# Patient Record
Sex: Female | Born: 1935 | Race: White | Hispanic: No | Smoking: Never smoker
Health system: Southern US, Community
[De-identification: ages and names within clinical notes are randomized; demographics above are authoritative.]

## PROBLEM LIST (undated history)

## (undated) DIAGNOSIS — I4891 Unspecified atrial fibrillation: Secondary | ICD-10-CM

---

## 2003-06-09 ENCOUNTER — Other Ambulatory Visit: Payer: Self-pay

## 2004-11-07 ENCOUNTER — Ambulatory Visit: Payer: Self-pay | Admitting: Internal Medicine

## 2005-02-21 ENCOUNTER — Ambulatory Visit: Payer: Self-pay | Admitting: Ophthalmology

## 2005-02-27 ENCOUNTER — Ambulatory Visit: Payer: Self-pay | Admitting: Ophthalmology

## 2005-08-14 ENCOUNTER — Ambulatory Visit: Payer: Self-pay | Admitting: Surgery

## 2005-12-22 ENCOUNTER — Emergency Department: Payer: Self-pay | Admitting: Emergency Medicine

## 2006-05-24 ENCOUNTER — Ambulatory Visit: Payer: Self-pay | Admitting: Internal Medicine

## 2006-09-23 ENCOUNTER — Ambulatory Visit: Payer: Self-pay | Admitting: Internal Medicine

## 2006-10-16 ENCOUNTER — Ambulatory Visit: Payer: Self-pay | Admitting: Internal Medicine

## 2010-08-25 ENCOUNTER — Ambulatory Visit: Payer: Self-pay | Admitting: Internal Medicine

## 2011-09-18 ENCOUNTER — Ambulatory Visit: Payer: Self-pay | Admitting: Internal Medicine

## 2011-11-06 LAB — BASIC METABOLIC PANEL
BUN: 19 mg/dL — ABNORMAL HIGH (ref 7–18)
Co2: 22 mmol/L (ref 21–32)
Creatinine: 0.98 mg/dL (ref 0.60–1.30)
Glucose: 144 mg/dL — ABNORMAL HIGH (ref 65–99)
Osmolality: 254 (ref 275–301)
Potassium: 4.3 mmol/L (ref 3.5–5.1)
Sodium: 124 mmol/L — ABNORMAL LOW (ref 136–145)

## 2011-11-06 LAB — CK TOTAL AND CKMB (NOT AT ARMC)
CK, Total: 122 U/L (ref 21–215)
CK-MB: 2.9 ng/mL (ref 0.5–3.6)

## 2011-11-06 LAB — CBC
HCT: 36.1 % (ref 35.0–47.0)
HGB: 12 g/dL (ref 12.0–16.0)
MCH: 28.8 pg (ref 26.0–34.0)
RBC: 4.18 10*6/uL (ref 3.80–5.20)
WBC: 5.2 10*3/uL (ref 3.6–11.0)

## 2011-11-07 ENCOUNTER — Inpatient Hospital Stay: Payer: Self-pay | Admitting: Internal Medicine

## 2011-11-07 LAB — TROPONIN I
Troponin-I: <0.02 ng/mL
Troponin-I: <0.02 ng/mL

## 2011-11-07 LAB — URINALYSIS, COMPLETE
Bacteria: NONE SEEN
Bilirubin,UR: NEGATIVE
Blood: NEGATIVE
Glucose,UR: NEGATIVE mg/dL (ref 0–75)
Ketone: NEGATIVE
Leukocyte Esterase: NEGATIVE
Squamous Epithelial: NONE SEEN

## 2011-11-07 LAB — PROTEIN, URINE, RANDOM: Protein, Random Urine: <5 mg/dL — ABNORMAL LOW (ref 0–12)

## 2011-11-07 LAB — CK TOTAL AND CKMB (NOT AT ARMC)
CK, Total: 208 U/L (ref 21–215)
CK-MB: 3.2 ng/mL (ref 0.5–3.6)

## 2011-11-07 LAB — OSMOLALITY, URINE: Osmolality: 183 mOsm/kg

## 2011-11-07 LAB — SODIUM
Sodium: 132 mmol/L — ABNORMAL LOW (ref 136–145)
Sodium: 132 mmol/L — ABNORMAL LOW (ref 136–145)

## 2011-11-08 LAB — CBC WITH DIFFERENTIAL/PLATELET
Eosinophil %: 3.2 %
HCT: 33.5 % — ABNORMAL LOW (ref 35.0–47.0)
HGB: 11.1 g/dL — ABNORMAL LOW (ref 12.0–16.0)
MCH: 28.7 pg (ref 26.0–34.0)
MCHC: 33.2 g/dL (ref 32.0–36.0)
Monocyte #: 0.5 x10 3/mm (ref 0.2–0.9)
Monocyte %: 12.2 %
Platelet: 184 10*3/uL (ref 150–440)

## 2011-11-08 LAB — BASIC METABOLIC PANEL
Calcium, Total: 8.5 mg/dL (ref 8.5–10.1)
Creatinine: 0.82 mg/dL (ref 0.60–1.30)
EGFR (African American): >60
EGFR (Non-African Amer.): >60
Osmolality: 270 (ref 275–301)
Sodium: 136 mmol/L (ref 136–145)

## 2011-11-08 LAB — MAGNESIUM: Magnesium: 1.4 mg/dL — ABNORMAL LOW

## 2012-08-28 ENCOUNTER — Emergency Department: Payer: Self-pay | Admitting: Emergency Medicine

## 2012-08-28 LAB — CBC WITH DIFFERENTIAL/PLATELET
Basophil #: 0.1 10*3/uL (ref 0.0–0.1)
Basophil %: 0.5 %
Eosinophil #: 0 10*3/uL (ref 0.0–0.7)
HCT: 33.7 % — ABNORMAL LOW (ref 35.0–47.0)
Lymphocyte %: 11 %
MCHC: 31.7 g/dL — ABNORMAL LOW (ref 32.0–36.0)
MCV: 81 fL (ref 80–100)
Neutrophil %: 82 %
RDW: 16.9 % — ABNORMAL HIGH (ref 11.5–14.5)
WBC: 13.1 10*3/uL — ABNORMAL HIGH (ref 3.6–11.0)

## 2012-08-28 LAB — COMPREHENSIVE METABOLIC PANEL
Albumin: 2.9 g/dL — ABNORMAL LOW (ref 3.4–5.0)
Alkaline Phosphatase: 117 U/L (ref 50–136)
Anion Gap: 9 (ref 7–16)
BUN: 22 mg/dL — ABNORMAL HIGH (ref 7–18)
Bilirubin,Total: 0.4 mg/dL (ref 0.2–1.0)
Chloride: 104 mmol/L (ref 98–107)
Co2: 25 mmol/L (ref 21–32)
EGFR (Non-African Amer.): >60
Glucose: 165 mg/dL — ABNORMAL HIGH (ref 65–99)
Osmolality: 283 (ref 275–301)
Potassium: 3.9 mmol/L (ref 3.5–5.1)
SGPT (ALT): 19 U/L (ref 12–78)
Sodium: 138 mmol/L (ref 136–145)
Total Protein: 6.5 g/dL (ref 6.4–8.2)

## 2012-08-28 LAB — URINALYSIS, COMPLETE
Bilirubin,UR: NEGATIVE
Blood: NEGATIVE
Leukocyte Esterase: NEGATIVE
Nitrite: NEGATIVE
Specific Gravity: 1.019 (ref 1.003–1.030)

## 2012-08-30 ENCOUNTER — Inpatient Hospital Stay: Payer: Self-pay

## 2012-08-30 LAB — CBC WITH DIFFERENTIAL/PLATELET
Basophil #: 0 10*3/uL (ref 0.0–0.1)
Basophil %: 0.3 %
HCT: 30.3 % — ABNORMAL LOW (ref 35.0–47.0)
Lymphocyte #: 1.1 10*3/uL (ref 1.0–3.6)
Lymphocyte %: 7.4 %
MCH: 28.7 pg (ref 26.0–34.0)
MCHC: 35.5 g/dL (ref 32.0–36.0)
Neutrophil %: 83 %

## 2012-08-30 LAB — URINALYSIS, COMPLETE
Bacteria: NONE SEEN
Glucose,UR: NEGATIVE mg/dL (ref 0–75)
Ketone: NEGATIVE
Leukocyte Esterase: NEGATIVE
Ph: 6 (ref 4.5–8.0)
Protein: NEGATIVE
RBC,UR: 1 /HPF (ref 0–5)
Squamous Epithelial: <1

## 2012-08-30 LAB — COMPREHENSIVE METABOLIC PANEL
Albumin: 2.6 g/dL — ABNORMAL LOW (ref 3.4–5.0)
Alkaline Phosphatase: 98 U/L (ref 50–136)
Anion Gap: 9 (ref 7–16)
BUN: 23 mg/dL — ABNORMAL HIGH (ref 7–18)
Bilirubin,Total: 1.2 mg/dL — ABNORMAL HIGH (ref 0.2–1.0)
Co2: 22 mmol/L (ref 21–32)
Creatinine: 0.72 mg/dL (ref 0.60–1.30)
EGFR (Non-African Amer.): >60
Glucose: 114 mg/dL — ABNORMAL HIGH (ref 65–99)
Osmolality: 273 (ref 275–301)
Potassium: 3.5 mmol/L (ref 3.5–5.1)
SGOT(AST): 31 U/L (ref 15–37)
SGPT (ALT): 18 U/L (ref 12–78)
Sodium: 134 mmol/L — ABNORMAL LOW (ref 136–145)
Total Protein: 6 g/dL — ABNORMAL LOW (ref 6.4–8.2)

## 2012-08-31 LAB — BASIC METABOLIC PANEL
Anion Gap: 7 (ref 7–16)
BUN: 18 mg/dL (ref 7–18)
Chloride: 105 mmol/L (ref 98–107)
EGFR (African American): >60
EGFR (Non-African Amer.): >60
Glucose: 109 mg/dL — ABNORMAL HIGH (ref 65–99)
Osmolality: 278 (ref 275–301)
Potassium: 3.3 mmol/L — ABNORMAL LOW (ref 3.5–5.1)
Sodium: 138 mmol/L (ref 136–145)

## 2012-09-01 LAB — CBC WITH DIFFERENTIAL/PLATELET
Basophil #: 0 10*3/uL (ref 0.0–0.1)
Eosinophil #: 0.1 10*3/uL (ref 0.0–0.7)
Eosinophil %: 2.2 %
HGB: 8.3 g/dL — ABNORMAL LOW (ref 12.0–16.0)
Lymphocyte #: 1.3 10*3/uL (ref 1.0–3.6)
Lymphocyte %: 19.4 %
MCH: 26.3 pg (ref 26.0–34.0)
MCHC: 32.5 g/dL (ref 32.0–36.0)
MCV: 81 fL (ref 80–100)
Monocyte #: 0.7 x10 3/mm (ref 0.2–0.9)
Monocyte %: 10.5 %
Neutrophil #: 4.4 10*3/uL (ref 1.4–6.5)
Platelet: 218 10*3/uL (ref 150–440)
RBC: 3.15 10*6/uL — ABNORMAL LOW (ref 3.80–5.20)
RDW: 16.5 % — ABNORMAL HIGH (ref 11.5–14.5)

## 2012-09-01 LAB — BASIC METABOLIC PANEL
Anion Gap: 7 (ref 7–16)
BUN: 17 mg/dL (ref 7–18)
Calcium, Total: 7.8 mg/dL — ABNORMAL LOW (ref 8.5–10.1)
Chloride: 108 mmol/L — ABNORMAL HIGH (ref 98–107)
Co2: 24 mmol/L (ref 21–32)
EGFR (African American): >60
EGFR (Non-African Amer.): >60
Glucose: 109 mg/dL — ABNORMAL HIGH (ref 65–99)
Osmolality: 280 (ref 275–301)
Potassium: 3.6 mmol/L (ref 3.5–5.1)
Sodium: 139 mmol/L (ref 136–145)

## 2012-09-02 LAB — CBC WITH DIFFERENTIAL/PLATELET
Basophil #: 0 10*3/uL (ref 0.0–0.1)
Eosinophil %: 3.1 %
HGB: 9.1 g/dL — ABNORMAL LOW (ref 12.0–16.0)
Lymphocyte %: 12.7 %
MCH: 26.9 pg (ref 26.0–34.0)
Monocyte #: 0.8 x10 3/mm (ref 0.2–0.9)
Monocyte %: 13.3 %
Neutrophil %: 70.6 %
RBC: 3.4 10*6/uL — ABNORMAL LOW (ref 3.80–5.20)

## 2012-09-02 LAB — URINALYSIS, COMPLETE
Blood: NEGATIVE
Nitrite: NEGATIVE
RBC,UR: NONE SEEN /HPF (ref 0–5)
Specific Gravity: 1.009 (ref 1.003–1.030)
Squamous Epithelial: <1

## 2012-09-06 LAB — CULTURE, BLOOD (SINGLE)

## 2012-09-11 ENCOUNTER — Inpatient Hospital Stay: Payer: Self-pay | Admitting: Family Medicine

## 2012-09-11 LAB — CBC
HCT: 27.8 % — ABNORMAL LOW (ref 35.0–47.0)
HGB: 8.8 g/dL — ABNORMAL LOW (ref 12.0–16.0)
MCH: 25 pg — ABNORMAL LOW (ref 26.0–34.0)
Platelet: 630 10*3/uL — ABNORMAL HIGH (ref 150–440)
RBC: 3.52 10*6/uL — ABNORMAL LOW (ref 3.80–5.20)
WBC: 12.8 10*3/uL — ABNORMAL HIGH (ref 3.6–11.0)

## 2012-09-11 LAB — CK TOTAL AND CKMB (NOT AT ARMC)
CK, Total: 31 U/L (ref 21–215)
CK, Total: 26 U/L (ref 21–215)
CK, Total: 33 U/L (ref 21–215)
CK-MB: <0.5 ng/mL — ABNORMAL LOW (ref 0.5–3.6)
CK-MB: 0.7 ng/mL (ref 0.5–3.6)

## 2012-09-11 LAB — URINALYSIS, COMPLETE
Bilirubin,UR: NEGATIVE
Glucose,UR: NEGATIVE mg/dL (ref 0–75)
Leukocyte Esterase: NEGATIVE
Protein: NEGATIVE
RBC,UR: 1 /HPF (ref 0–5)
Transitional Epi: 1

## 2012-09-11 LAB — BASIC METABOLIC PANEL
Anion Gap: 12 (ref 7–16)
BUN: 23 mg/dL — ABNORMAL HIGH (ref 7–18)
EGFR (African American): >60
Glucose: 104 mg/dL — ABNORMAL HIGH (ref 65–99)
Osmolality: 269 (ref 275–301)

## 2012-09-11 LAB — TROPONIN I
Troponin-I: <0.02 ng/mL
Troponin-I: <0.02 ng/mL
Troponin-I: <0.02 ng/mL

## 2012-09-13 LAB — CBC WITH DIFFERENTIAL/PLATELET
Basophil #: 0 10*3/uL (ref 0.0–0.1)
Basophil %: 0.7 %
Lymphocyte %: 24.7 %
MCH: 26.3 pg (ref 26.0–34.0)
MCHC: 32.9 g/dL (ref 32.0–36.0)
Monocyte #: 0.5 x10 3/mm (ref 0.2–0.9)
Monocyte %: 10.9 %
Neutrophil #: 2.4 10*3/uL (ref 1.4–6.5)
Neutrophil %: 58.2 %
RBC: 2.93 10*6/uL — ABNORMAL LOW (ref 3.80–5.20)
RDW: 16.9 % — ABNORMAL HIGH (ref 11.5–14.5)
WBC: 4.2 10*3/uL (ref 3.6–11.0)

## 2012-09-13 LAB — BASIC METABOLIC PANEL
Anion Gap: 8 (ref 7–16)
Chloride: 106 mmol/L (ref 98–107)
Co2: 23 mmol/L (ref 21–32)
Creatinine: 0.57 mg/dL — ABNORMAL LOW (ref 0.60–1.30)
EGFR (African American): >60
EGFR (Non-African Amer.): >60
Glucose: 81 mg/dL (ref 65–99)
Osmolality: 274 (ref 275–301)
Potassium: 3.6 mmol/L (ref 3.5–5.1)

## 2012-09-14 LAB — URINE CULTURE

## 2012-09-16 LAB — CULTURE, BLOOD (SINGLE)

## 2012-09-23 ENCOUNTER — Ambulatory Visit: Payer: Self-pay | Admitting: Family Medicine

## 2013-04-26 ENCOUNTER — Inpatient Hospital Stay: Payer: Self-pay

## 2013-04-26 LAB — CBC
HCT: 34.9 % — ABNORMAL LOW (ref 35.0–47.0)
HGB: 11.5 g/dL — ABNORMAL LOW (ref 12.0–16.0)
MCH: 28.5 pg (ref 26.0–34.0)
Platelet: 216 10*3/uL (ref 150–440)
WBC: 5 10*3/uL (ref 3.6–11.0)

## 2013-04-26 LAB — COMPREHENSIVE METABOLIC PANEL
Albumin: 3.1 g/dL — ABNORMAL LOW (ref 3.4–5.0)
BUN: 29 mg/dL — ABNORMAL HIGH (ref 7–18)
Calcium, Total: 9 mg/dL (ref 8.5–10.1)
Chloride: 101 mmol/L (ref 98–107)
Co2: 29 mmol/L (ref 21–32)
EGFR (African American): 58 — ABNORMAL LOW
Glucose: 220 mg/dL — ABNORMAL HIGH (ref 65–99)
Osmolality: 288 (ref 275–301)
SGOT(AST): 24 U/L (ref 15–37)
SGPT (ALT): 17 U/L (ref 12–78)

## 2013-04-26 LAB — URINALYSIS, COMPLETE
Bilirubin,UR: NEGATIVE
Glucose,UR: NEGATIVE mg/dL (ref 0–75)
Ketone: NEGATIVE
Nitrite: NEGATIVE
Ph: 7 (ref 4.5–8.0)
Protein: NEGATIVE
RBC,UR: 2 /HPF (ref 0–5)
WBC UR: 8 /HPF (ref 0–5)

## 2013-04-27 LAB — CBC WITH DIFFERENTIAL/PLATELET
Basophil #: 0 10*3/uL (ref 0.0–0.1)
Basophil %: 0.4 %
Eosinophil %: 3.9 %
HCT: 29.4 % — ABNORMAL LOW (ref 35.0–47.0)
HGB: 9.8 g/dL — ABNORMAL LOW (ref 12.0–16.0)
Lymphocyte %: 34.8 %
MCHC: 33.3 g/dL (ref 32.0–36.0)
MCV: 86 fL (ref 80–100)
Monocyte #: 0.5 x10 3/mm (ref 0.2–0.9)
Neutrophil %: 50.7 %
Platelet: 180 10*3/uL (ref 150–440)
RDW: 16.4 % — ABNORMAL HIGH (ref 11.5–14.5)
WBC: 4.9 10*3/uL (ref 3.6–11.0)

## 2013-04-27 LAB — HEMOGLOBIN: HGB: 11.2 g/dL — ABNORMAL LOW (ref 12.0–16.0)

## 2013-04-28 LAB — HEMOGLOBIN: HGB: 10.3 g/dL — ABNORMAL LOW (ref 12.0–16.0)

## 2014-10-01 NOTE — Consult Note (Signed)
Brief Consult Note: Diagnosis: Right shoulder minimally displaced surgical neck fx with inferior subluxation; greater troch fx of the left hip.   CommentsLarey Seat: Fell on Thursday.  Called for consult on hip fracture but family mentions left arm as well. Was seen by Dr. Ernest PineHooten for left shoulder pain that long preexisted this fall.  Was scheduled for follow up this week.   Significant ecchymosis left arm.  Good motion and strength of hand.  Sensation globally decreased left hand - per patient this is her baseline. In swathe.  Left hip non tender over left GT.  Excellent strength of hip abductors, adductors, flexors and extensors.  Painless passive ROM  Will review radiographs from office of the left shoulder.  Will follow conservatively for now given patient's age and this being her non-dominant extremity.  If difficulty healing or ongoing pain on follow-up, may consider for hemiarthroplasty.  At this point family does not think they would choose to proceed with surgery.  Recommend WBAT for leg.  Recommend change to sling with waist belt because wrist strap of swathe may be getting too tight with swelling..  Electronic Signatures: Murlean Harkamasunder, Laretha Luepke (MD)  (Signed 24-Mar-14 15:13)  Authored: Brief Consult Note   Last Updated: 24-Mar-14 15:13 by Murlean Harkamasunder, Javar Eshbach (MD)

## 2014-10-01 NOTE — Discharge Summary (Signed)
PATIENT NAME:  [name], Jennifer [name] MR#:  [id number] DATE OF BIRTH:  15-Nov-1916  DATE OF ADMISSION:  09/11/2012 DATE OF DISCHARGE:  09/13/2012  ADDENDUM  The patient was held 1 additional night for further evaluation. Her white blood cell count did trend down to 4.2; however, her hemoglobin also trended down to 7.7, which will need to be evaluated as an outpatient. Day of discharge, her creatinine was 0.57. Her sodium was 137 and her potassium was 3.6. Her urine culture is still pending at this time. A repeat chest x-ray was done which did show improvement but still showed atelectasis versus pneumonia. When questioning the patient, she now endorses a cough but remains afebrile and sputum free. I plan to switch her antibiotics from Cipro to Levaquin for better coverage of both pneumonia and also UTI. [facility] and will follow up with Dr. Sampson GoonFitzgerald upon discharge from there.   ____________________________ Marisue IvanKanhka Rahn Lacuesta, MD kl:ce D: 09/13/2012 08:08:17 ET T: 09/13/2012 13:03:14 ET JOB#: [id number]  cc: Marisue IvanKanhka Rodney Yera, MD, <Dictator> Marisue IvanKANHKA Sahej Schrieber MD ELECTRONICALLY SIGNED 09/19/2012 10:01

## 2014-10-01 NOTE — Discharge Summary (Signed)
PATIENT NAME:  [name], Jennifer M MR#:  [id number] DATE OF BIRTH:  July 16, 1916  DATE OF ADMISSION:  04/26/2013 DATE OF DISCHARGE:  04/28/2013  DISCHARGE DIAGNOSES:  1.  Lower gastrointestinal bleed.  2.  Hypertension.   HISTORY OF PRESENT ILLNESS: Please see initial history and physical for details. Briefly, this is a 81 year old, quite active female with a history of hypertension, who was admitted with lower GI bleed.   HOSPITAL COURSE: GI bleed. The patient has had this in the past. It resolved spontaneously, likely of diverticula or hemorrhoidal bleeding. She was not seen by GI. Her hemoglobin remained stable over 2 days. Her aspirin was held. She did quite well and had no further issues. She remained on her metoprolol for her blood pressure. We held her Lasix until discharge. The patient will follow up with Dr. Sampson GoonFitzgerald in 1 to 2 days to check a hemoglobin and re-evaluate for GI bleeding.   DISCHARGE DIET: Low salt, regular consistency. Avoid seeds, nuts and popcorn.   DISCHARGE ACTIVITY: As tolerated.   TIME SPENT: This discharge took 35 minutes. ____________________________ Stann Mainlandavid P. Sampson GoonFitzgerald, MD dpf:aw D: 04/28/2013 08:17:56 ET T: 04/28/2013 08:23:18 ET JOB#: [id number]  cc: Stann Mainlandavid P. Sampson GoonFitzgerald, MD, <Dictator> Keyton Bhat Sampson GoonFITZGERALD MD ELECTRONICALLY SIGNED 04/28/2013 9:06

## 2014-10-01 NOTE — Discharge Summary (Signed)
PATIENT NAME:  Jennifer SeatsSMITH, Nikol M MR#:  [id number] DATE OF BIRTH:  01/09/1917  DATE OF ADMISSION:  09/11/2012 DATE OF DISCHARGE:  09/12/2012  NOTE:  Date of discharge to be determined, pending at this time.   DISCHARGE DIAGNOSES: 1.  Chest pain that has resolved. 2.  Fever that has resolved.  3.  History of recurrent urinary tract infections. 4.  Osteoarthritis.  5.  History of humeral and hip fracture.  6.  History of hypertension.  7.  History of supraventricular tachycardia.  8.  History of adult-onset diabetes with diet control.  9.  History of gout.   DISCHARGE MEDICATIONS: 1.  Aspirin 81 mg p.o. daily.  2.  Zofran 4 mg p.o. every 4 hours as needed for nausea and vomiting.  3.  Allopurinol 300 mg p.o. daily.  4.  Nitroglycerin 0.4 mg sublingual x 3 doses every 5 minutes as needed for chest pain. 5.  Furosemide 20 mg 2 tabs Monday, Wednesday and Friday and 1 tab the other 4 days.  6.  Metoprolol tartrate 50 mg 1-1/2 tabs p.o. b.i.d.  7.  Alprazolam 0.25 mg p.o. at bedtime p.r.n.  8.  Klor-Con 10 milliequivalents 1 tab p.o. b.i.d.  9.  Magnesium oxide 400 mg p.o. b.i.d.  10.  Acetaminophen 325 mg every 4 hours as needed for pain not to exceed 4 grams per day.  11.  Acetaminophen/hydrocodone 325/5 mg 1 tab p.o. every 4 hours as needed for pain.  12.  Ciprofloxacin 250 mg p.o. b.i.d.   CONSULTS: None.   PROCEDURES: None.   PERTINENT LABS AND STUDIES:  Cardiac enzymes were negative x 3. Chest x-ray showed questionable infiltrate. White blood cell count 12.8, hemoglobin 8.8 and platelets of 630. Urinalysis showed negative leuk esterase but 7 white blood cells on micro. Previous culture was positive for enterococcus.   BRIEF HOSPITAL COURSE:  1.  Chest discomfort. The patient initially was admitted with chest discomfort. No EKG changes. Cardiac enzymes were negative x 3. Symptoms resolved within 24 hours.  2.  Fever.  The patient initially came in with elevated temperature with  concern for possible pneumonia and UTI.  She had no clinical symptoms of pneumonia; therefore, the ceftriaxone and vancomycin were discontinued since there was concern for possible hospital-acquired pneumonia, however, her urinalysis did show white blood cell counts on the micro study despite a negative gross UA. A urine culture was ordered and that is pending at this time and that is what is holding up her discharge since we want to see exactly what regimen to send her out on.  Right now looking back on previous urine culture, she grew out enterococcus sensitive to Cipro so right now she is being covered with that.  3.  Other chronic medical issues remain stable at this time. No change to those current regimens. [facility] be [facility] with history of hip and humeral fracture.   DISPOSITION: She is in stable condition to be discharged to the facility.  Just waiting further studies, as far as the urine culture and treatment plan.  She will likely be discharged tomorrow. Please await addendum tomorrow. ____________________________ Marisue IvanKanhka Unique Searfoss, MD kl:sb D: 09/12/2012 14:34:45 ET T: 09/12/2012 14:50:06 ET JOB#: [id number]  cc: Marisue IvanKanhka Janaria Mccammon, MD, <Dictator> Marisue IvanKANHKA Johnchristopher Sarvis MD ELECTRONICALLY SIGNED 09/19/2012 10:01

## 2014-10-01 NOTE — Discharge Summary (Signed)
PATIENT NAME:  Jennifer [name], Jennifer [name] MR#:  [id number] DATE OF BIRTH:  09-24-1916  DATE OF ADMISSION:  08/30/2012 DATE OF DISCHARGE:  09/03/2012  PRIMARY CARE PHYSICIAN: Clydie Braunavid Fitzgerald, [name].D.   CONSULTANTS: Murlean HarkShalini Ramasunder, MD - Orthopedics.  DISCHARGE DIAGNOSES: 1.  Fall.  2.  Left humerus fracture.  3.  Right greater trochanter fracture.  4.  Anemia.  5.  Fever and leukocytosis.  HISTORY OF PRESENT ILLNESS: Please see admission history and physical for details. Briefly, this is a 81 year old woman who was at home, suffered a fall, mechanical, and had a fracture of her left humerus. She was seen in the Emergency Room. Discharged home from there with a sling; however, she fell again, fractured her greater trochanter, was admitted for pain control and further evaluation. She does have a history of known hypertension.   HOSPITAL COURSE BY ISSUE:  1.  Falls. She was seen by physical therapy who recommended that she will need rehab.  2.  Left humerus fracture. Seen by orthopedics, continued in a sling. She did develop a lot of ecchymoses and swelling at the site and some weeping. No evidence of infection but will have to monitor for cellulitis.  3.  Right greater trochanter fracture. Seen by orthopedics who recommended no surgical treatment, can be full weight-bearing on it as tolerated.  4.  Anemia. Her hemoglobin did decrease, but stabilized around 9.1. Likely from ecchymosis. No other active bleeding.  5.  Fever and leukocytosis. Blood cultures, urine cultures were done. Urine culture did grow greater than 100,000 enterococcus; however, UA x 2 was completely negative with no leukocyte esterase or nitrites. This was not treated.   DISCHARGE MEDICATIONS: 1.  Aspirin 81 mg once a day.  2.  Zofran 4 mg every 4 hours as needed.  3.  Allopurinol 1 tablet once a day.  4.  Nitroglycerin 0.4 p.r.n.   5.  Lasix 20 mg 2 tablets Monday, Wednesday and Thursday, 1 tablet other days.  6.  Metoprolol 50 mg  1-1/2 tablets 2 times a day.  7.  Alprazolam 0.25 mg 1 tablet orally at bedtime.  8.  Klor-Con 10 milliequivalents extended-release 1 tablet twice a day.  9.  Magnesium oxide 400 twice a day.  10.  Tramadol 50 mg q. 4 hours p.r.n. pain.  11.  Acetaminophen 325 mg every 4 hours as needed for pain or fever.   DRESSING CARE:  The patient should have a dry dressing placed in the areas of drainage over any left arm areas that are draining.  HOME OXYGEN: None.   DISCHARGE DIET: Low sodium, regular consistency.   DISCHARGE ACTIVITY: Weight-bearing as tolerated.   DISCHARGE INSTRUCTIONS:  The patient will followup with Sampson GoonFitzgerald in 1 to 2 weeks for hospital followup. She will follow up with orthopedics from there if needed.   TIME SPENT:  This discharge took 35 minutes.  ____________________________ Stann Mainlandavid P. Sampson GoonFitzgerald, MD dpf:sb D: 09/03/2012 08:48:35 ET T: 09/03/2012 08:58:56 ET JOB#: [id number]  cc: Stann Mainlandavid P. Sampson GoonFitzgerald, MD, <Dictator> DAVID Sampson GoonFITZGERALD MD ELECTRONICALLY SIGNED 09/10/2012 15:43

## 2014-10-01 NOTE — H&P (Signed)
PATIENT NAME:  Jennifer [name] MR#:  [id number] DATE OF BIRTH:  07-02-1916  DATE OF ADMISSION:  04/26/2013  PRIMARY CARE PHYSICIAN: Dr. Sampson GoonFitzgerald.   CHIEF COMPLAINT: Bright red blood per rectum x 2.   HISTORY OF PRESENT ILLNESS: A 81 year old Caucasian female patient with history of hypertension, SVT, osteoarthritis, hemorrhoids, rectal polyp, presents to the Emergency Room from home complaining of bright red blood per rectum x 2. The patient does not complain of any abdominal pain, nausea, vomiting, hematemesis, lightheadedness, palpitation, shortness of breath. She had a similar presentation 4 years back. She mentions that at that time she was diagnosed with diverticulosis. The patient does have a history of hemorrhoids and malignant rectal polyp.   The patient's hemoglobin is 11.5. She is on aspirin at home. Does not use any NSAIDs. No other bleeding sites.   PAST MEDICAL HISTORY:  1. Hypertension.  2. SVT.   3. Diet-controlled diabetes mellitus.  4. Esophageal stricture, status post dilation.  5. Gout.  6. Hemorrhoids.  7. Malignant rectal polyp, status post resection.  8. Diverticulosis.  9. Osteoarthritis.  10. Left humerus fracture.  11. Right trochanteric femur fracture.   PAST SURGICAL HISTORY:  1. Hysterectomy.  2. Pelvic reconstruction of uterine prolapse.  3. Right cataract surgery.   ALLERGIES: DAYPRO.   HOME MEDICATIONS: Include:  1. Aspirin 81 mg daily.  2. Zofran 4 mg every 4 hours as needed.  3. Allopurinol 100 mg daily.  4. Nitroglycerin 0.4 mg as needed for chest pain.  5. Metoprolol 75 mg twice a day.  6. Alprazolam 0.25 mg at bedtime as needed.  7. Acetaminophen 650 mg every 4 hours as needed for pain, fever.  8. Tramadol 50 mg every 6 hours as needed for pain.   SOCIAL HISTORY: The patient lives at home with her daughter. Does not smoke. No alcohol. No illicit drugs.   CODE STATUS: FULL CODE.    FAMILY HISTORY: Both parents had heart disease.    REVIEW OF SYSTEMS:  CONSTITUTIONAL: No fever, fatigue, weakness.  EYES: No blurred vision, pain, redness.  ENT: No tinnitus, ear pain, hearing loss.  RESPIRATORY: No cough, wheezing, shortness of breath.  CARDIOVASCULAR: No chest pain, orthopnea, edema.  GASTROINTESTINAL: No nausea, vomiting, diarrhea, abdominal pain. Did have bright red blood per rectum.  GENITOURINARY: No dysuria, hematuria.   ENDOCRINE: No polyuria, nocturia, thyroid problems.  HEMATOLOGIC AND LYMPHATIC: No anemia, easy bruising, bleeding.  INTEGUMENTARY: No acne, rash, lesion.  MUSCULOSKELETAL: Has arthritis, back pain.  NEUROLOGIC: No focal numbness, weakness. Has history of seizure and stroke.  PSYCHIATRIC: No anxiety or depression.   PHYSICAL EXAMINATION:  VITAL SIGNS: Temperature 97.8, pulse of 81, blood pressure 148/71, saturating 94% on room air.  GENERAL: Elderly, frail, Caucasian female patient lying in bed, seems comfortable, conversational, cooperative with exam.  PSYCHIATRIC: Alert and oriented x 3. Mood and affect appropriate. Judgment intact.  HEENT: Atraumatic, normocephalic. Oral mucosa moist and pink. External ears and nose normal. No pallor or icterus. Pupils bilaterally equal and reactive to light.  NECK: Supple. No thyromegaly or palpable lymph nodes. Trachea midline. No carotid bruit, JVD.  HEART: S1 and S2 without any murmurs. Peripheral pulses 2+.  RESPIRATORY: No increased work of breathing. Clear to auscultation on both sides.  GASTROINTESTINAL: Soft abdomen, nontender. Bowel sounds present. No organomegaly palpable.  RECTAL: Exam deferred at it was done in the Emergency Room and found hemorrhoids.  GENITOURINARY: No CVA tenderness or bladder distention.  SKIN: Warm and dry. No petechiae,  rash, ulcers.  MUSCULOSKELETAL: No joint swelling, redness, effusion of the large joints. Normal muscle tone.  NEUROLOGICAL: Motor strength 5/5 in upper and lower extremities. Sensation is intact all over.   LYMPHATIC: No cervical lymphadenopathy.   LABORATORY STUDIES: Show glucose of 220, BUN 29, creatinine 0.98, sodium 138, potassium 3.6. AST, ALT, bilirubin normal. WBC 5, hemoglobin 11.5, with platelets of 216, MCV 86.   INR 1.   Urinalysis shows no bacteria.   ASSESSMENT AND PLAN:  1. Bright red blood per rectum: Likely secondary from hemorrhoids or diverticulosis. Did have 2 episodes. Admit the patient as inpatient. The patient will be monitored on the medical floor. She is unstable. Will need inpatient care. May need sigmoidoscopy if there is any further drop in hemoglobin. Will hold the aspirin. No heparin products. Had similar episode 4 years back. The patient's hemoglobin is at 11.5, but if she had bleeding, this would significantly drop.  2. Hypertension: Continue medications.  3. Deep venous thrombosis prophylaxis with sequential compression devices.   TIME SPENT ON THIS CASE: 40 minutes.    ____________________________ Jennifer Bailiff Ani Deoliveira, MD srs:gb D: 04/27/2013 01:40:29 ET T: 04/27/2013 02:39:26 ET JOB#: [id number]  cc: Wardell Heath R. Basil Buffin, MD, <Dictator> Stann Mainland. Sampson Goon, MD Wardell Heath West Bali MD ELECTRONICALLY SIGNED 04/27/2013 20:48

## 2014-10-01 NOTE — H&P (Signed)
PATIENT NAME:  Jennifer [name], Jennifer M MR#:  [id number] DATE OF BIRTH:  03-Dec-1916  DATE OF ADMISSION:  09/11/2012  REFERRING PHYSICIAN: Rebecka ApleyAllison P. Webster, MD   PRIMARY CARE PHYSICIAN: Stann Mainlandavid P. Sampson GoonFitzgerald, MD   CHIEF COMPLAINT: Chest pain.   HISTORY OF PRESENT ILLNESS: The patient is a 81 year old pleasant white female with past medical history of hypertension, supraventricular tachycardia, osteoarthritis, who had multiple falls recently, sustained a left humerus fracture and right greater trochanteric fracture. As the patient was severely debilitated, the patient was admitted under Dr. Sampson GoonFitzgerald. The patient was clinically doing well. The patient was discharged to skilled nursing facility. The patient comes back to the Emergency Department with complaints of chest pain, pressure-like pain in the midsternal area. No radiation. No associated shortness of breath. In the Emergency Department, the patient was also found to be febrile, had elevated white blood cell count of 12.8. During the hospital stay, the patient was found to have urine cultures positive for Enterococcus; however, urinalysis x2 was negative. The patient had extensive swelling of the left upper extremity with oozing; however, currently does not have any increased redness or warmth in that area. The patient states has improved pain in the arm. Chest x-ray done in the Emergency Department, even though it does not have good inspiration, shows right lower lobe pneumonia. The patient received vancomycin and Zosyn in the Emergency Department, considering the patient's multiple admissions recently.   PAST MEDICAL HISTORY:  1. Hypertension.  2. History of supraventricular tachycardia.  3. Diabetes mellitus, diet controlled.  4. History of esophageal stricture, status post dilatation. 5. Gout.  6. Hemorrhoids.  7. Malignant rectal polyp, status post resection.  8. Diverticulosis.  9. Osteoarthritis.  10. Recent left humerus fracture.  11. Recent  right trochanteric fracture.   PAST SURGICAL HISTORY:  1. Hysterectomy.  2. Pelvic reconstruction of the uterine prolapse. 3. Right cataract surgery.   ALLERGIES: DAYPRO.   HOME MEDICATIONS:  1. Aspirin 81 mg once a day.  2. Zofran 4 mg every 4 hours as needed.  3. Allopurinol 1 tablet once a day.  4. Nitroglycerin 0.4 mg as needed.  5. Lasix 20 mg 2 tablets Monday, Wednesday and Thursday, and 1 tablet the other days.  6. Metoprolol 75 mg 2 times a day.  7. Alprazolam 0.25 mg at bedtime.  8. Klor-Con 10 mEq  1 tablet twice daily.  9. Tramadol 50 mg every 4 hours as needed.  10. Acetaminophen 325 mg every 4 hours as needed for fever or pain.   SOCIAL HISTORY: No history of smoking, drinking, alcohol or using illicit drugs. Prior to hospital admission, the patient was living with her daughter. Currently, discharged to skilled nursing facility.   FAMILY HISTORY: Noncontributory in this 81 year old female; however, both parents died of heart disease.   REVIEW OF SYSTEMS:  CONSTITUTIONAL: Generalized weakness.  EYES: No change in vision; however, experiences some blurred vision.  ENT: Hard of hearing. RESPIRATORY: Denies having any cough or shortness of breath.  CARDIOVASCULAR: Complains of chest pain.  GASTROINTESTINAL: No nausea, vomiting, diarrhea or abdominal pain. GENITOURINARY: No dysuria; however, has a strong odor to the urine.  ENDOCRINE: No polyuria or polydipsia.  HEMATOLOGIC: No easy bruising or bleeding.  SKIN: No rash or lesions.  MUSCULOSKELETAL: Has chronic back pain and osteoarthritis.  NEUROLOGIC: History of stroke.  PSYCHIATRIC: Denies any depression.   PHYSICAL EXAMINATION:  GENERAL: This is a frail-looking elderly female, appropriate for the age, lying down in the bed, not in distress.  VITAL SIGNS: Current temperature is 98.7, pulse 98, blood pressure 98/44, respiratory rate of 16, oxygen saturation is 98% on 2 liters of oxygen.  HEENT: Head normocephalic,  atraumatic. Eyes: No scleral icterus. Conjunctivae normal. Pupils equal and reactive to light. Mucous membranes dry.  NECK: Supple. No lymphadenopathy. No JVD. No carotid bruit.  CHEST: Has no focal tenderness. Could not appreciate any crackles; however, somewhat decreased breath sounds in the lower lobes.  HEART: S1 and S2 regular, tachycardia.  ABDOMEN: Bowel sounds are present. Soft, nontender, nondistended.  EXTREMITIES: Left upper extremity has significant dependent edema; however, no redness or tenderness. Lower extremities: No pedal edema. Pulses 2+.  NEUROLOGIC: The patient is alert, oriented to place, person and time. Cranial nerves II through XII intact. No motor and sensory deficits.   LABORATORIES: CMP is completely within normal limits. CBC: WBC of 12.8, hemoglobin 8.8, platelet count of 630.   ASSESSMENT AND PLAN: The patient is a 81 year old female who recently had frequent hospital visits and admission for frequent falls and fractures, who comes to the Emergency Department from the nursing home with chest pain, fever and elevated white blood cell count.   1. Chest pain. Considering the patient's age, will admit the patient to the monitored bed, cycle cardiac enzymes x3. Otherwise, no further workup is needed in this elderly female.  2. Pneumonia. The patient had chest x-ray which is of poor quality. Will obtain chest x-ray PA and lateral. However, considering the patient's fever and elevated white blood cell count, will treat it as an infection. The patient received vancomycin ad Zosyn in the Emergency Department. Will continue with Rocephin and vancomycin.  3. Urinary tract infection, even though urinalysis x2 was negative, as urine was positive for more than 100,000 colonies of Enterococcus which was sensitive to amoxicillin. Currently, will treat with vancomycin as we are covering for the pneumonia. However, the patient could be discharged home with amoxicillin to complete the course  of the urinary tract infection.  4. Debility. Will involve the physical therapy, occupational therapy.  5. Anemia. The patient has a slow drift of hemoglobin, most likely from anemia of chronic disease. Will check the iron profile, B12, folate, RBC and retic count.  6. Hypotension. Will hold the Lasix for now considering the patient's dry mucous membranes. The patient received 500 mL of bolus, with improvement of the blood pressure to over 100.  7. Keep the patient on deep vein thrombosis prophylaxis with Lovenox.   TIME SPENT: 50 minutes, reviewing old records, discussing with the patient and history and physical documentation. More than 50% of the time spent examining the patient, obtaining the history and explaining to the patient's family about the patient's condition.    ____________________________ Susa Griffins, MD pv:OSi D: 09/11/2012 07:40:18 ET T: 09/11/2012 08:25:49 ET JOB#: [id number]  cc: Susa Griffins, MD, <Dictator> Stann Mainland. Sampson Goon, MD Clerance Lav Agnieszka Newhouse MD ELECTRONICALLY SIGNED 09/16/2012 2:04

## 2014-10-01 NOTE — H&P (Signed)
PATIENT NAME:  Jennifer [name], Jennifer [name] MR#:  [id number] DATE OF BIRTH:  1916/09/22  DATE OF ADMISSION:  08/30/2012  PRIMARY CARE PHYSICIAN: Clydie Braun, MD  REFERRING PHYSICIAN:  Jene Every, MD  CHIEF COMPLAINT: Frequent falls.   HISTORY OF PRESENT ILLNESS:  The patient is a 81 year old pleasant white female with a past medical history of hypertension, supraventricular tachycardia and osteoarthritis, who presented to the Emergency Department with complaints of fall. The patient has been having frequent falls lately.  Her last fall was 2 to 3 days back, at which time she sustained a left humerus fracture. The patient was brought to the Emergency Department, seen by orthopedic surgery and was discharged home with a brace. The patient had another episode of fall yesterday. Considering this, the family brought her to the Emergency Department. The patient is an somewhat hard of feeding, however, is unable to provide history. The patient states did not lose consciousness at any given time during these falls. This is all from the loss of balance. Had work-up in the Emergency Department with previous x-rays showed comminuted fracture of the left humerus head involving the greater tuberosity and surgical neck.  Considering the frequent falls, the patient's family was unable to provide for this patient and requested further placement.   PAST MEDICAL HISTORY: 1.  Malignant rectal polyp, status post resection.  2.  Diverticulosis.  3.  Hemorrhoids.  4.  Gout.  5.  TIA in 1996.  6.  Esophageal stricture, status post dilatation. 7.  Hypertension.  8.  Supraventricular tachycardia.  9.  Diabetes mellitus, diet controlled.  10.  Cataract, status post right cataract surgery.  11.  Osteoarthritis.   PAST SURGICAL HISTORY:  1.  Abdominal hysterectomy.  2.  Pelvic reconstruction for uterine prolapse.  3.  Right cataract surgery.  ALLERGIES:  DAYPRO.  HOME MEDICATIONS:  1.  Zofran 4 mg every 4 hours as  needed.  2.  Tramadol 50 mg every 4 hours as needed.  3.  Nitroglycerin 0.4 mg sublingual.  4.  Metoprolol 75 mg 2 times a day.  5.  Magnesium oxide 400 mg 2 times a day.  6.  Klor-Con 10 mEq daily.  7.  Lasix 20 mEq Monday Wednesday Friday.  8,  Aspirin 81 mg daily.  9.  Alprazolam 0.25 mg once a day.  10.  Allopurinol 300 mg once a day.   SOCIAL HISTORY: No history of smoking, drinking alcohol or using illicit drugs. Currently lives with her daughter.   FAMILY HISTORY: Noncontributory at the of 89; however, both parents died of heart disease.   REVIEW OF SYSTEMS: CONSTITUTIONAL:  Generalized weakness. No fever.  EYES: Sometimes blurred vision. No eye discharge.  ENT: No ear discharge, dysphagia, has some difficulty hearing.  RESPIRATORY:  No shortness of breath, cough. CARDIOVASCULAR: No chest pain, palpitations.  GASTROINTESTINAL: No nausea, vomiting, diarrhea, abdominal pain.  GENITOURINARY: No dysuria or hematuria.  ENDOCRINE: No polyphagia or polydipsia.  HEMATOLOGIC: No easy bruising or bleeding. SKIN: No rash or lesions.  MUSCULOSKELETAL: Has chronic back pain and osteoarthritis.  NEUROLOGIC:  History of stroke.  PSYCHIATRIC:  Denies any depression.  PHYSICAL EXAMINATION:  GENERAL: This is a frail-looking female lying down in the bed, not in distress.  VITAL SIGNS: Temperature 98, pulse 91, blood pressure 164/70, respiratory rate of 16, oxygen saturation is 94% on room air.  HEENT: Head normocephalic, atraumatic. There is no scleral icterus. Conjunctivae normal. Pupils equal and react to light. Mucous membranes mild dryness. NECK: Supple.  No lymphadenopathy. No JVD. No carotid bruit.  CHEST: Has no focal tenderness. LUNGS:  Bilaterally clear to auscultation.  HEART: S1, S2, regular no murmurs are heard.  ABDOMEN: Bowel sounds present. Soft, nontender, nondistended.  EXTREMITIES: Right lower extremity slightly different than the left; however, this is  chronic. NEUROLOGIC:   The patient is alert, oriented to place, person and time. No apparent cranial nerve abnormalities.  Left upper extremity in the sling.   LABORATORY AND DIAGNOSTIC DATA:  CBC: WBC of 15.6, hemoglobin 10.8, platelet count of 214. CBC: WBC of 13.1, hemoglobin 10.7, platelet count 245. CMP is completely within normal limits except for the albumin of 2.6.   ASSESSMENT AND PLAN: This patient is a 81 year old female who comes to the Emergency Department with a frequent falls.   1.  Frequent falls. We will check the orthostatic blood pressure.  Considering the patient's dry mucous membranes, we will hold the Lasix. Minimize the sedative medication in this elderly, frail-looking female.  Continue with intravenous fluids. We will involve the physical therapy and occupational therapy and we will involve the case management for possible placement for the rehab.  2.  Hypertension moderately controlled.  This could be secondary to a combination of anxiety. Continue to follow up.  3.  Right humerus fracture. Continue with the splint.  4.  Debility. We will involve the physical therapy, occupational therapy.   Time spent: 45 minutes.    ____________________________ Susa GriffinsPadmaja Tawna Alwin, MD pv:ct D: 08/30/2012 07:21:45 ET T: 08/30/2012 13:23:32 ET JOB#: [id number]  cc: Susa GriffinsPadmaja Wilhelmena Zea, MD, <Dictator> Stann Mainlandavid P. Sampson GoonFitzgerald, MD Clerance LavPADMAJA Jaleena Viviani MD ELECTRONICALLY SIGNED 09/02/2012 1:31

## 2014-10-03 NOTE — H&P (Signed)
PATIENT NAME:  Jennifer [name], Jennifer [name] MR#:  [id number] DATE OF BIRTH:  12-16-1916  DATE OF ADMISSION:  11/07/2011  REFERRING PHYSICIAN: Joseph Art, MD  PRIMARY CARE PHYSICIAN: Conchita Paris, MD  PRESENTING COMPLAINT: Chest heaviness.   HISTORY OF PRESENT ILLNESS: Jennifer [name] is a pleasant 81 year old woman with history of transient ischemic attack, hypertension, hyperlipidemia, supraventricular tachycardia, history of malignant rectal polyp, and history of diabetes who presents today with reports of developing chest pressure for the past two weeks, on and off, occurring at rest and on exertion. She had recurrence of symptoms today when she was sitting on the front porch. [name] in a walk-in clinic and the walk-in clinic referred for the patient to be evaluated here in the ED. Reports that her chest heaviness does not last more than a minute and is not associated with any other symptoms including nausea, vomiting, shortness of breath, or diaphoresis. No radiation of pain. Denies any palpitations, presyncope, or syncope.   PAST MEDICAL HISTORY:  1. Malignant rectal polyp status post resection.  2. Diverticulosis.  3. Hemorrhoids.  4. Gout.  5. Transient ischemic attack in 1996.  6. Esophageal stricture status post dilatation.  7. Hypertension.  8. History of supraventricular tachycardia.  9. Diabetes. Last A1c in March 2013 of 7, diet controlled.  10. Cataracts status post right eye cataract surgery.  11. Arthritis.  PAST SURGICAL HISTORY:  1. Abdominal hysterectomy.  2. Pelvic reconstruction for uterine prolapse.  3. Right cataract surgery.   ALLERGIES: Daypro and perfume causes shortness of breath.   MEDICATIONS: The patient cannot verify medications and reports that she is on five prescriptions plus also taking aspirin. According to [facility] notes she is on the following:  1. Aldactone 25 mg daily.  2. Allopurinol 300  mg daily.  3. Aspirin 81 mg daily.  4. Cardizem CD 120 mg daily.  5. Nexium 20 mg daily.  6. Maxzide 37.5/25 mg daily.  7. The patient denies being on Ativan or tramadol.   SOCIAL HISTORY: She lives with her daughter in [place] who works at [employer]. No tobacco or alcohol use. She is widowed.   FAMILY HISTORY: Both parents had heart disease.   REVIEW OF SYSTEMS: CONSTITUTIONAL: No fevers, nausea, or vomiting. EYES: History of cataracts. ENT: No epistaxis, discharge, or dysphagia. She has difficulty hearing. RESPIRATORY: She has been dealing with cough and recently completed antibiotics and has some residual cough, nonproductive. No wheezing or hemoptysis. CARDIOVASCULAR: As per history of present illness. GI: No nausea, vomiting, diarrhea, abdominal pain, or hematemesis. GU: No dysuria or hematuria. ENDOCRINE: No polyuria or polydipsia. HEME: She has easy bruising. SKIN: No ulcers. MUSCULOSKELETAL: She has chronic back pain and arthritis. NEUROLOGIC: History of stroke. PSYCH: Denies any suicidal ideation.   PHYSICAL EXAMINATION:   VITAL SIGNS: Temperature 97, pulse 83, respiratory rate 20, blood pressure 140/55, and saturating 98% on room air.   GENERAL: Lying in bed in no apparent distress.   HEENT: Normocephalic, atraumatic. Pupils equal, symmetric, and anicteric. Nares without discharge. Moist mucous membranes.   NECK: Soft and supple. No adenopathy or JVP.   HEART:  Non-tachy. No murmurs, rubs, or gallops.   LUNGS: Clear to auscultation bilaterally. No use of accessory muscles or increased respiratory effort.   ABDOMEN: Soft. Positive bowel sounds. No mass appreciated.   EXTREMITIES: She has right ankle edema, 2+ pitting, with 1+ edema up to the mid shin.  Dorsal pedis pulses intact.   MUSCULOSKELETAL: No joint effusion.   SKIN: No ulcers.   NEUROLOGIC: No dysarthria or aphasia. Symmetrical strength. No focal deficits.   PSYCH: She is alert and oriented. The patient  is cooperative.  PERTINENT LABS/STUDIES: WBC 5.2, hemoglobin 12, hematocrit 36.1, platelet 216, and MCV 86. Glucose 144, BUN 19, creatinine 0.98, sodium 124, potassium 4.3, chloride 91, carbon dioxide 22, and calcium 9. Troponin less than 0.02. CK 122 and MB 2.9.   EKG with sinus rhythm with PACs. No ST elevation or depression.   Chest x-ray without any acute findings.   ASSESSMENT AND PLAN: Jennifer [name] is a 81 year old woman with history of diabetes, hypertension, hyperlipidemia, diverticulosis, malignant rectal polyp, transient ischemic attack, and esophageal stricture status post dilatation presenting with complaints of chest fullness and pressure.  1. Atypical chest pain: The patient is with risk factors. Continue on telemetry, cycle cardiac enzymes, and obtain a Myoview. Further work-up and cardiology consult pending results. Her TSH was within the normal limits in March of 2013 along with her LDL and HDL. Her A1c was 7. We will restart aspirin. We will add on beta blocker and nitroglycerin sublingual as needed. Questionable related to recurrent esophageal stricture. We will increase her Nexium dose.  2. Hyponatremia, likely multifactorial, with the being on Maxzide plus/minus p.o. intake: Her serum albumin is low. TSH is within normal limits. We will send a urinalysis, uric acid level, and urine electrolytes. Hold her Maxzide and Aldactone for now and continue with normal saline repletion. Follow sodium level every four hours. Continue on telemetry. 3. Right lower extremity edema, acute on chronic: As above, holding her diuretics.  4. Diabetes, diet controlled: The patient is unaware of the diagnosis. We will hold off on sliding scale insulin.  5. Prophylaxis with aspirin, Lovenox, and Nexium.   TIME SPENT: Approximately 50 minutes was spent on patient care. ____________________________ Reuel DerbyAlounthith Laneice Meneely, MD ap:slb D: 11/07/2011 00:27:38 ET T: 11/07/2011 09:37:14  ET JOB#: [id number]  cc: Pearlean BrownieAlounthith Nehal Shives, MD, <Dictator> Don C. Candelaria Stagershaplin, MD  Reuel DerbyALOUNTHITH Lahoma Constantin MD ELECTRONICALLY SIGNED 11/15/2011 22:31

## 2014-10-03 NOTE — Discharge Summary (Signed)
PATIENT NAME:  Jennifer [name], Jennifer [name] MR#:  [id number] DATE OF BIRTH:  26-Mar-1917  DATE OF ADMISSION:  11/07/2011 DATE OF DISCHARGE:  11/08/2011  REASON FOR ADMISSION: Chest heaviness.   HISTORY OF PRESENT ILLNESS: Please see the dictated history of present illness done by Dr. Margie EgePhichith on 11/07/2011.   PAST MEDICAL HISTORY:  1. History of malignant rectal polyp status post resection.  2. Diverticulosis.  3. Internal hemorrhoids.  4. Gout.  5. History of TIAs.  6. History of esophageal stricture, status post dilatation.  7. Benign hypertension.  8. History of supraventricular tachycardia.  9. Type II diabetes.  10. History of cataracts.  11. Osteoarthritis. 12. Status post hysterectomy.   MEDICATIONS ON ADMISSION: Please see admission note.   ALLERGIES: Daypro and perfume.   SOCIAL HISTORY, FAMILY HISTORY, AND REVIEW OF SYSTEMS: As per history of present illness.   PHYSICAL EXAMINATION: The patient was in no acute distress. Vital signs were stable and she was afebrile. HEENT exam was unremarkable. NECK was supple without JVD. LUNGS were clear. CARDIAC exam revealed a regular rate and rhythm with normal S1 and S2. ABDOMEN soft and nontender. EXTREMITIES revealed 1+ edema. NEUROLOGIC exam was grossly nonfocal.   LABORATORY DATA: Sodium 124. EKG showed sinus rhythm with no acute ischemic changes.   HOSPITAL COURSE: The patient was admitted with atypical chest pain and hyponatremia. She was taken off her Aldactone and Maxzide with improvement of her sodium. Cardiac enzymes remained normal. She underwent Lexiscan Myoview which was negative for ischemia. She did have some mild tachycardia that was treated with Lopressor with improvement of her symptoms. By 11/08/2011, the patient was stable and ready for discharge.   DISCHARGE DIAGNOSES:  1. Noncardiac chest pain.  2. Presumed gastroesophageal reflux disease.  3. Hyponatremia, resolved.  4. Hypomagnesemia.  5. Benign hypertension.   6. Supraventricular tachycardia.  7. Venostasis with peripheral edema.  8. Type II diabetes.   DISCHARGE MEDICATIONS:  1. Lopressor 75 mg p.o. b.i.d.  2. Nexium 40 mg p.o. daily.  3. Aspirin 81 mg p.o. daily.  4. Allopurinol 300 mg p.o. daily.  5. Nitroglycerin 0.4 mg sublingually p.r.n. chest pain.  6. Zofran 4 mg p.o. q.6 hours p.r.n. nausea and vomiting.  7. Norco 5/325 mg 1 to 2 p.o. q.4 hours p.r.n. pain.   FOLLOW-UP PLANS AND APPOINTMENTS:  1. The patient was discharged on a 2 gram sodium diet.  2. She will follow-up with Dr. Candelaria Stagershaplin next week, sooner if needed.   ____________________________ Duane LopeJeffrey D. Judithann SheenSparks, MD jds:drc D: 11/08/2011 07:31:51 ET T: 11/08/2011 12:21:18 ET JOB#: [id number]  cc: Duane LopeJeffrey D. Judithann SheenSparks, MD, <Dictator> JEFFREY Rodena Medin SPARKS MD ELECTRONICALLY SIGNED 11/08/2011 [id number]:24

## 2016-06-13 ENCOUNTER — Observation Stay
Admission: EM | Admit: 2016-06-13 | Discharge: 2016-06-14 | Disposition: A | Discharge status: Home / Self Care | Payer: Medicare Other | Attending: Internal Medicine | Admitting: Internal Medicine

## 2016-06-13 ENCOUNTER — Emergency Department: Payer: Medicare Other

## 2016-06-13 DIAGNOSIS — I700 Atherosclerosis of aorta: Secondary | ICD-10-CM | POA: Insufficient documentation

## 2016-06-13 DIAGNOSIS — I083 Combined rheumatic disorders of mitral, aortic and tricuspid valves: Secondary | ICD-10-CM | POA: Diagnosis not present

## 2016-06-13 DIAGNOSIS — J988 Other specified respiratory disorders: Secondary | ICD-10-CM

## 2016-06-13 DIAGNOSIS — M1A9XX0 Chronic gout, unspecified, without tophus (tophi): Secondary | ICD-10-CM | POA: Insufficient documentation

## 2016-06-13 DIAGNOSIS — M4802 Spinal stenosis, cervical region: Secondary | ICD-10-CM | POA: Insufficient documentation

## 2016-06-13 DIAGNOSIS — Z7982 Long term (current) use of aspirin: Secondary | ICD-10-CM | POA: Diagnosis not present

## 2016-06-13 DIAGNOSIS — E860 Dehydration: Secondary | ICD-10-CM | POA: Diagnosis not present

## 2016-06-13 DIAGNOSIS — I959 Hypotension, unspecified: Secondary | ICD-10-CM | POA: Insufficient documentation

## 2016-06-13 DIAGNOSIS — I6523 Occlusion and stenosis of bilateral carotid arteries: Secondary | ICD-10-CM | POA: Insufficient documentation

## 2016-06-13 DIAGNOSIS — R05 Cough: Secondary | ICD-10-CM

## 2016-06-13 DIAGNOSIS — M5033 Other cervical disc degeneration, cervicothoracic region: Secondary | ICD-10-CM | POA: Insufficient documentation

## 2016-06-13 DIAGNOSIS — R55 Syncope and collapse: Principal | ICD-10-CM | POA: Diagnosis present

## 2016-06-13 DIAGNOSIS — R059 Cough, unspecified: Secondary | ICD-10-CM

## 2016-06-13 DIAGNOSIS — I10 Essential (primary) hypertension: Secondary | ICD-10-CM | POA: Insufficient documentation

## 2016-06-13 DIAGNOSIS — R42 Dizziness and giddiness: Secondary | ICD-10-CM

## 2016-06-13 DIAGNOSIS — I482 Chronic atrial fibrillation: Secondary | ICD-10-CM | POA: Insufficient documentation

## 2016-06-13 DIAGNOSIS — F419 Anxiety disorder, unspecified: Secondary | ICD-10-CM | POA: Diagnosis not present

## 2016-06-13 HISTORY — DX: Unspecified atrial fibrillation: I48.91

## 2016-06-13 LAB — CBC
HEMATOCRIT: 36.7 % (ref 35.0–47.0)
Hemoglobin: 12.1 g/dL (ref 12.0–16.0)
MCH: 28.6 pg (ref 26.0–34.0)
MCHC: 33 g/dL (ref 32.0–36.0)
MCV: 86.8 fL (ref 80.0–100.0)
PLATELETS: 233 10*3/uL (ref 150–440)
RBC: 4.23 MIL/uL (ref 3.80–5.20)
RDW: 16.2 % — AB (ref 11.5–14.5)
WBC: 6.8 10*3/uL (ref 3.6–11.0)

## 2016-06-13 LAB — URINALYSIS, COMPLETE (UACMP) WITH MICROSCOPIC
Bilirubin Urine: NEGATIVE
GLUCOSE, UA: NEGATIVE mg/dL
KETONES UR: NEGATIVE mg/dL
Nitrite: NEGATIVE
PROTEIN: NEGATIVE mg/dL
SQUAMOUS EPITHELIAL / LPF: NONE SEEN
Specific Gravity, Urine: 1.009 (ref 1.005–1.030)
pH: 6 (ref 5.0–8.0)

## 2016-06-13 LAB — BASIC METABOLIC PANEL
Anion gap: 9 (ref 5–15)
BUN: 25 mg/dL — AB (ref 6–20)
CO2: 27 mmol/L (ref 22–32)
CREATININE: 1.07 mg/dL — AB (ref 0.44–1.00)
Calcium: 9.4 mg/dL (ref 8.9–10.3)
Chloride: 103 mmol/L (ref 101–111)
GFR calc Af Amer: 49 mL/min — ABNORMAL LOW (ref >60)
GFR calc non Af Amer: 42 mL/min — ABNORMAL LOW (ref >60)
Glucose, Bld: 144 mg/dL — ABNORMAL HIGH (ref 65–99)
POTASSIUM: 3.5 mmol/L (ref 3.5–5.1)
Sodium: 139 mmol/L (ref 135–145)

## 2016-06-13 LAB — RAPID INFLUENZA A&B ANTIGENS (ARMC ONLY)
INFLUENZA A (ARMC): NEGATIVE
INFLUENZA B (ARMC): NEGATIVE

## 2016-06-13 LAB — TROPONIN I: Troponin I: <0.03 ng/mL (ref <0.03)

## 2016-06-13 MED ORDER — ALLOPURINOL 100 MG PO TABS
300.0000 mg | ORAL_TABLET | Freq: Every day | ORAL | Status: DC
  Administered 2016-06-14: 300 mg via ORAL
  Filled 2016-06-13: qty 3

## 2016-06-13 MED ORDER — ALPRAZOLAM 0.25 MG PO TABS: 0.2500 mg | ORAL_TABLET | Freq: Three times a day (TID) | ORAL | Status: DC | PRN

## 2016-06-13 MED ORDER — SODIUM CHLORIDE 0.9 % IV SOLN
INTRAVENOUS | Status: DC
  Administered 2016-06-14: 01:00:00 via INTRAVENOUS

## 2016-06-13 MED ORDER — ONDANSETRON HCL 4 MG/2ML IJ SOLN: 4.0000 mg | Freq: Four times a day (QID) | INTRAMUSCULAR | Status: DC | PRN

## 2016-06-13 MED ORDER — TRAZODONE HCL 50 MG PO TABS: 25.0000 mg | ORAL_TABLET | Freq: Every evening | ORAL | Status: DC | PRN

## 2016-06-13 MED ORDER — ACETAMINOPHEN 325 MG PO TABS: 650.0000 mg | ORAL_TABLET | Freq: Four times a day (QID) | ORAL | Status: DC | PRN

## 2016-06-13 MED ORDER — PANTOPRAZOLE SODIUM 40 MG PO TBEC
40.0000 mg | DELAYED_RELEASE_TABLET | Freq: Every day | ORAL | Status: DC
  Administered 2016-06-14: 40 mg via ORAL
  Filled 2016-06-13: qty 1

## 2016-06-13 MED ORDER — DOCUSATE SODIUM 100 MG PO CAPS
100.0000 mg | ORAL_CAPSULE | Freq: Two times a day (BID) | ORAL | Status: DC
  Administered 2016-06-14: 100 mg via ORAL
  Filled 2016-06-13: qty 1

## 2016-06-13 MED ORDER — HYDROCODONE-ACETAMINOPHEN 5-325 MG PO TABS: 1.0000 | ORAL_TABLET | ORAL | Status: DC | PRN

## 2016-06-13 MED ORDER — ONDANSETRON HCL 4 MG PO TABS: 4.0000 mg | ORAL_TABLET | Freq: Four times a day (QID) | ORAL | Status: DC | PRN

## 2016-06-13 MED ORDER — ACETAMINOPHEN 650 MG RE SUPP: 650.0000 mg | Freq: Four times a day (QID) | RECTAL | Status: DC | PRN

## 2016-06-13 MED ORDER — ASPIRIN EC 81 MG PO TBEC
81.0000 mg | DELAYED_RELEASE_TABLET | Freq: Every day | ORAL | Status: DC
  Administered 2016-06-14: 81 mg via ORAL
  Filled 2016-06-13: qty 1

## 2016-06-13 MED ORDER — SODIUM CHLORIDE 0.9 % IV BOLUS (SEPSIS): 1000.0000 mL | INTRAVENOUS | Status: DC | PRN

## 2016-06-13 MED ORDER — BISACODYL 5 MG PO TBEC: 5.0000 mg | DELAYED_RELEASE_TABLET | Freq: Every day | ORAL | Status: DC | PRN

## 2016-06-13 MED ORDER — HEPARIN SODIUM (PORCINE) 5000 UNIT/ML IJ SOLN
5000.0000 [IU] | Freq: Three times a day (TID) | INTRAMUSCULAR | Status: DC
  Administered 2016-06-14 (×2): 5000 [IU] via SUBCUTANEOUS
  Filled 2016-06-13 (×2): qty 1

## 2016-06-13 NOTE — ED Notes (Signed)
ED Provider at bedside. 

## 2016-06-13 NOTE — ED Notes (Signed)
Helped pt to bathroom and cleaned up pt. Hooked pt up to monitor.

## 2016-06-13 NOTE — ED Notes (Signed)
Lab tech notified off adding Troponin for patient on previous blood, lab tech verbally acknowledged.

## 2016-06-13 NOTE — ED Triage Notes (Signed)
Pt arrived via ems for c/o fall - family reported that pt was walking into kitchen and fell causing several skin tears to left arm and left side of neck - pt denies any pain - pt is A&O x4 but does appear to be sleepy - denies LOC - denies hitting head 

## 2016-06-13 NOTE — ED Notes (Signed)
Pt arrived via ems for c/o fall - family reported that pt was walking into kitchen and fell causing several skin tears to left arm and left side of neck - pt denies any pain - pt is A&O x4 but does appear to be sleepy - denies LOC - denies hitting head

## 2016-06-13 NOTE — ED Provider Notes (Addendum)
[facility] Emergency Department Provider Note  ____________________________________________  Time seen: Approximately 3:46 PM  I have reviewed the triage vital signs and the nursing notes.   HISTORY  Chief Complaint Fall    HPI Jennifer [name] is a 83 y.o. female with a history of atrial fibrillation on 81 mg asa daily presenting for syncopal episode. The patient reports for the past 2 days she has had some "chest congestion," that she has been treating with Mucinex. She is not had any associated sore throat, ear pain, rhinorrhea, fever or chills. No nausea vomiting or diarrhea. She has been eating and drinking normally. Today, she was sitting in her chair when she became lightheaded, so stood up to get a drink of water from the kitchen and the next thing she knew she was laying on the ground. Her family member heard her fall and came immediately, and within several seconds the patient was cognizant of who she was and where she was. She did not have any preceding chest pain, shortness of breath, palpitations. She does not have any headache, blurred or double vision, numbness tingling or weakness.   Past Medical History:  Diagnosis Date  . A-fib (HCC)     There are no active problems to display for this patient.   History reviewed. No pertinent surgical history.    Allergies Patient has no known allergies.  No family history on file.  Social History Social History  Substance Use Topics  . Smoking status: Never Smoker  . Smokeless tobacco: Never Used  . Alcohol use No    Review of Systems Constitutional: No fever/chills.Positive lightheadedness. Positive dizziness. Positive syncope. Positive fall. Eyes: No visual changes. No blurred or double vision. No eye discharge. ENT: No sore throat. No congestion or rhinorrhea. Cardiovascular: Denies chest pain. Denies palpitations. Respiratory: Denies shortness of breath.  Positive mild nonproductive cough,  with "chest congestion.". Gastrointestinal: No abdominal pain.  No nausea, no vomiting.  No diarrhea.  No constipation. Genitourinary: Negative for dysuria. Musculoskeletal: Negative for back pain. No neck pain Skin: Negative for rash. Neurological: Negative for headaches. No focal numbness, tingling or weakness. Unable to ambulate after the fall due to generalized weakness.  10-point ROS otherwise negative.  ____________________________________________   PHYSICAL EXAM:  VITAL SIGNS: ED Triage Vitals  Enc Vitals Group     BP 06/13/16 1330 117/70     Pulse Rate 06/13/16 1448 77     Resp 06/13/16 1448 17     Temp --      Temp src --      SpO2 06/13/16 1448 100 %     Weight 06/13/16 1318 130 lb (59 kg)     Height 06/13/16 1318 5\' 6"  (1.676 m)     Head Circumference --      Peak Flow --      Pain Score 06/13/16 1318 0     Pain Loc --      Pain Edu? --      Excl. in GC? --     Constitutional: Alert and oriented. Well appearing and in no acute distress. Answers questions appropriately. Eyes: Conjunctivae are normal.  EOMI. No scleral icterus.No raccoon eyes. Head: Atraumatic. No raccoon eyes, battle sign. No midface instability. Nose: No congestion/rhinnorhea. No swelling over the nose, no septal hematoma. Mouth/Throat: Mucous membranes are mildly dry. No dental injury or malocclusion. Neck: No stridor.  Supple.  No cervical spine tenderness to palpation, step-offs or deformities. No meningismus. On the left lateral  neck, the patient has a V-shaped 3 cm skin tear without deep laceration. He is hemostatic. Cardiovascular: Normal rate, regular rhythm. No murmurs, rubs or gallops.  Respiratory: Normal respiratory effort.  No accessory muscle use or retractions. Lungs CTAB.  No wheezes, rales or ronchi. Gastrointestinal: Soft, nontender and mildly distended.  No guarding or rebound.  No peritoneal signs. Musculoskeletal: No LE edema. No ttp in the calves or palpable cords.  Negative  Homan's sign. Pelvis is stable. 1 x 1 cm bruise on the right patella, 2 by once in a meter bruise on the left patella, without any knee effusions, or pain with full range of motion. Normal DP and PT pulses bilaterally. Full range of motion of the bilateral wrists, elbows and shoulders without pain. Neurologic:  A&Ox3.  Speech is clear.  Face and smile are symmetric.  EOMI.  PERRLA. 5 out of 5 strength in the bilateral grips, biceps, triceps, hip flexors, dorsiflexion and plantarflexion. Skin:  Skin is warm, dry. No rash noted. The mid forearm on the left has a V-shaped 2 cm skin tear that is superficial; the left lateral elbow has a V-shaped 3 cm skin tear that is superficial. See above for neck skin tear. Psychiatric: Mood and affect are normal. Speech and behavior are normal.  Normal judgement.  ____________________________________________   LABS (all labs ordered are listed, but only abnormal results are displayed)  Labs Reviewed  BASIC METABOLIC PANEL - Abnormal; Notable for the following:       Result Value   Glucose, Bld 144 (*)    BUN 25 (*)    Creatinine, Ser 1.07 (*)    GFR calc non Af Amer 42 (*)    GFR calc Af Amer 49 (*)    All other components within normal limits  CBC - Abnormal; Notable for the following:    RDW 16.2 (*)    All other components within normal limits  RAPID INFLUENZA A&B ANTIGENS ([facility] ONLY)  TROPONIN I  URINALYSIS, COMPLETE (UACMP) WITH MICROSCOPIC   ____________________________________________  EKG  ED ECG REPORT I, Rockne Menghini, the attending physician, personally viewed and interpreted this ECG.   Date: 06/13/2016  EKG Time: 1341  Rate: 62  Rhythm: normal sinus rhythm  Axis: normal  Intervals:none  ST&T Change: RBBB. No ST elevation.  ____________________________________________  RADIOLOGY  Dg Chest 2 View  Result Date: 06/13/2016 CLINICAL DATA:  Fall. EXAM: CHEST  2 VIEW COMPARISON:  Radiographs of September 13, 2012. FINDINGS: The  heart size and mediastinal contours are within normal limits. Both lungs are clear. No pneumothorax or pleural effusion is noted. Atherosclerosis of thoracic aorta is noted. Degenerative changes seen involving both glenohumeral joints. Stable old compression fractures are noted in thoracic spine. IMPRESSION: No active cardiopulmonary disease.  Aortic atherosclerosis. Electronically Signed   By: Lupita Raider, M.D.   On: 06/13/2016 16:18   Ct Head Wo Contrast  Result Date: 06/13/2016 CLINICAL DATA:  Syncope with fall. Left upper extremity and neck skin tears. EXAM: CT HEAD WITHOUT CONTRAST CT CERVICAL SPINE WITHOUT CONTRAST TECHNIQUE: Multidetector CT imaging of the head and cervical spine was performed following the standard protocol without intravenous contrast. Multiplanar CT image reconstructions of the cervical spine were also generated. COMPARISON:  08/30/2012 head CT. FINDINGS: CT HEAD FINDINGS Brain: No evidence of parenchymal hemorrhage or extra-axial fluid collection. No mass lesion, mass effect, or midline shift. No CT evidence of acute infarction. Nonspecific mild subcortical and periventricular white matter hypodensity, most in keeping with  chronic small vessel ischemic change. Cerebral volume is age appropriate. No ventriculomegaly. Vascular: No hyperdense vessel or unexpected calcification. Skull: No evidence of calvarial fracture. Sinuses/Orbits: The visualized paranasal sinuses are essentially clear. Other: Small inferior left mastoid effusion. The right mastoid air cells are unopacified. CT CERVICAL SPINE FINDINGS Alignment: Normal cervical lordosis. No subluxation. Dens is well positioned between the lateral masses of C1. Skull base and vertebrae: No acute fracture. No primary bone lesion or focal pathologic process. Soft tissues and spinal canal: No prevertebral fluid or swelling. No visible canal hematoma. Disc levels: Moderate to severe multilevel degenerative disc disease throughout the  cervical spine. Severe bilateral facet arthropathy. Mild degenerative foraminal stenosis bilaterally at C3-4. Mild right and moderate left degenerative foraminal stenosis at C4-5. Mild right degenerative foraminal stenosis at C5-6. Upper chest: Symmetric mild biapical pleural-parenchymal scarring. Other: Visualized right mastoid air cells appear clear. Small inferior left mastoid effusion. Heterogeneous thyroid gland without discrete thyroid nodules. No pathologically enlarged cervical nodes. IMPRESSION: 1. No evidence of acute intracranial abnormality. No evidence of calvarial fracture. 2. Mild chronic small vessel ischemia. 3. Nonspecific small left mastoid effusion. 4. No cervical spine fracture or subluxation. 5. Advanced degenerative changes in the cervical spine as described. Electronically Signed   By: Delbert Phenix M.D.   On: 06/13/2016 16:23   Ct Cervical Spine Wo Contrast  Result Date: 06/13/2016 CLINICAL DATA:  Syncope with fall. Left upper extremity and neck skin tears. EXAM: CT HEAD WITHOUT CONTRAST CT CERVICAL SPINE WITHOUT CONTRAST TECHNIQUE: Multidetector CT imaging of the head and cervical spine was performed following the standard protocol without intravenous contrast. Multiplanar CT image reconstructions of the cervical spine were also generated. COMPARISON:  08/30/2012 head CT. FINDINGS: CT HEAD FINDINGS Brain: No evidence of parenchymal hemorrhage or extra-axial fluid collection. No mass lesion, mass effect, or midline shift. No CT evidence of acute infarction. Nonspecific mild subcortical and periventricular white matter hypodensity, most in keeping with chronic small vessel ischemic change. Cerebral volume is age appropriate. No ventriculomegaly. Vascular: No hyperdense vessel or unexpected calcification. Skull: No evidence of calvarial fracture. Sinuses/Orbits: The visualized paranasal sinuses are essentially clear. Other: Small inferior left mastoid effusion. The right mastoid air cells  are unopacified. CT CERVICAL SPINE FINDINGS Alignment: Normal cervical lordosis. No subluxation. Dens is well positioned between the lateral masses of C1. Skull base and vertebrae: No acute fracture. No primary bone lesion or focal pathologic process. Soft tissues and spinal canal: No prevertebral fluid or swelling. No visible canal hematoma. Disc levels: Moderate to severe multilevel degenerative disc disease throughout the cervical spine. Severe bilateral facet arthropathy. Mild degenerative foraminal stenosis bilaterally at C3-4. Mild right and moderate left degenerative foraminal stenosis at C4-5. Mild right degenerative foraminal stenosis at C5-6. Upper chest: Symmetric mild biapical pleural-parenchymal scarring. Other: Visualized right mastoid air cells appear clear. Small inferior left mastoid effusion. Heterogeneous thyroid gland without discrete thyroid nodules. No pathologically enlarged cervical nodes. IMPRESSION: 1. No evidence of acute intracranial abnormality. No evidence of calvarial fracture. 2. Mild chronic small vessel ischemia. 3. Nonspecific small left mastoid effusion. 4. No cervical spine fracture or subluxation. 5. Advanced degenerative changes in the cervical spine as described. Electronically Signed   By: Delbert Phenix M.D.   On: 06/13/2016 16:23    ____________________________________________   PROCEDURES  Procedure(s) performed: None  Procedures  Critical Care performed: No ____________________________________________   INITIAL IMPRESSION / ASSESSMENT AND PLAN / ED COURSE  Pertinent labs & imaging results that were available during my  care of the patient were reviewed by me and considered in my medical decision making (see chart for details).  83 y.o. female with a history of atrial fibrillation presenting for syncopal episode after several days of "chest congestion." Overall, the patient is well-appearing and a symptomatic at this time she has multiple skin tears  although no evidence of acute injury in the left lateral neck or left upper extremity where her skin tears are. Given her lightheadedness and syncope, I'll get a chest x-ray, CT of the head, and laboratory studies. Also get a urinalysis and influenza screening. An intermittent arrhythmia is possible although her EKG here is in sinus rhythm and does not show any ischemic changes. The patient will likely require admission to the hospital for further monitoring, evaluation and treatment.  ____________________________________________  FINAL CLINICAL IMPRESSION(S) / ED DIAGNOSES  Final diagnoses:  Syncope, unspecified syncope type  Cough  Congestion of upper airway    Clinical Course as of Jun 14 1711  Wed Jun 13, 2016  1712 The patient's workup so far is reassuring. She has some mild renal insufficiency but is not orthostatic on exam. She is not anemic, her troponin is negative. Her chest x-ray does not show any acute cardiopulmonary process. Her CT scan does not show any intracranial process either. I'm awaiting the results of her influenza testing, and will plan admission for her syncopal episode.  [AN]    Clinical Course User Index [AN] Rockne Menghini, MD      NEW MEDICATIONS STARTED DURING THIS VISIT:  New Prescriptions   No medications on file      Rockne Menghini, MD 06/13/16 1713    Rockne Menghini, MD 06/13/16 [phone]

## 2016-06-13 NOTE — H&P (Signed)
[facility] [place] [facility]   PATIENT NAME: Jennifer [name]    MR#:  [id number]  DATE OF BIRTH:  05/05/1917  DATE OF ADMISSION:  06/13/2016  PRIMARY CARE PHYSICIAN: Mick SellFITZGERALD, DAVID P, MD   REQUESTING/REFERRING PHYSICIAN: Dr. Koren BoundANNE-Caroline  CHIEF COMPLAINT: Syncope    Chief Complaint  Patient presents with  . Fall    HISTORY OF PRESENT ILLNESS:  Jennifer [name]  is a 83 y.o. female with a known history of chronic fibrillations, essential hypertension comes in because of syncope. Patient felt lightheaded this afternoon, she went to get something from kitchen and then next thing she noticed. She passed out, and patient's grandson noticed her in the kitchen after  2 min  regained her consciousness. He has no shortness of breath, no headache. Patient's daughter noticed she is been having some cough lately. Patient suffered some minor laceration to the neck. CT head, CT neck unremarkable in the emergency room. Initial lab data unremarkable.  PAST MEDICAL HISTORY:   Past Medical History:  Diagnosis Date  . A-fib (HCC)     PAST SURGICAL HISTOIRY:  History reviewed. No pertinent surgical history.  SOCIAL HISTORY:   Social History  Substance Use Topics  . Smoking status: Never Smoker  . Smokeless tobacco: Never Used  . Alcohol use No    FAMILY HISTORY:  No family history on file.  DRUG ALLERGIES:  No Known Allergies  REVIEW OF SYSTEMS:  CONSTITUTIONAL: No fever, fatigue or weakness.  EYES: No blurred or double vision.  EARS, NOSE, AND THROAT: No tinnitus or ear pain.  RESPIRATORY: No cough, shortness of breath, wheezing or hemoptysis.  CARDIOVASCULAR: No chest pain, orthopnea, edema.  GASTROINTESTINAL: No nausea, vomiting, diarrhea or abdominal pain.  GENITOURINARY: No dysuria, hematuria.  ENDOCRINE: No polyuria, nocturia,  HEMATOLOGY: No anemia, easy bruising or bleeding SKIN: No rash or lesion. MUSCULOSKELETAL: No joint pain or arthritis.    NEUROLOGIC: No tingling, numbness, weakness.  PSYCHIATRY: No anxiety or depression.   MEDICATIONS AT HOME:   Prior to Admission medications   Medication Sig Start Date End Date Taking? Authorizing Provider  allopurinol (ZYLOPRIM) 300 MG tablet Take 300 mg by mouth daily.   Yes Historical Provider, MD  ALPRAZolam (XANAX) 0.25 MG tablet Take 1 tablet by mouth every 8 (eight) hours as needed. 06/01/16  Yes Historical Provider, MD  aspirin EC 81 MG tablet Take 81 mg by mouth daily.   Yes Historical Provider, MD  furosemide (LASIX) 40 MG tablet Take 1 tablet by mouth daily. 05/30/16  Yes Historical Provider, MD  KLOR-CON 10 10 MEQ tablet Take 1 tablet by mouth 2 (two) times daily. 04/03/16  Yes Historical Provider, MD  metoprolol (LOPRESSOR) 100 MG tablet Take 50 mg by mouth 2 (two) times daily.   Yes Historical Provider, MD  omeprazole (PRILOSEC) 20 MG capsule Take 1 capsule by mouth daily. 04/21/16  Yes Historical Provider, MD      VITAL SIGNS:  Blood pressure (!) 109/54, pulse (!) 41, temperature 98.6 F (37 C), temperature source Oral, resp. rate (!) 28, height 5\' 6"  (1.676 m), weight 59 kg (130 lb), SpO2 100 %.  PHYSICAL EXAMINATION:  GENERAL:  83 y.o.-year-old patient lying in the bed with no acute distress.  EYES: Pupils equal, round, reactive to light and accommodation. No scleral icterus. Extraocular muscles intact.  HEENT: Head atraumatic, normocephalic. Oropharynx and nasopharynx clear.  NECK:  Supple, no jugular venous distention. No thyroid enlargement, no tenderness.  LUNGS: Normal breath  sounds bilaterally, no wheezing, rales,rhonchi or crepitation. No use of accessory muscles of respiration.  CARDIOVASCULAR: S1, S2 normal. No murmurs, rubs, or gallops.  ABDOMEN: Soft, nontender, nondistended. Bowel sounds present. No organomegaly or mass.  EXTREMITIES: No pedal edema, cyanosis, or clubbing.  NEUROLOGIC: Cranial nerves II through XII are intact. Muscle strength 5/5 in all  extremities. Sensation intact. Gait not checked.  PSYCHIATRIC: The patient is alert and oriented x 3.  SKIN: No obvious rash, lesion, or ulcer.   LABORATORY PANEL:   CBC  Recent Labs Lab 06/13/16 1328  WBC 6.8  HGB 12.1  HCT 36.7  PLT 233   ------------------------------------------------------------------------------------------------------------------  Chemistries   Recent Labs Lab 06/13/16 1328  NA 139  K 3.5  CL 103  CO2 27  GLUCOSE 144*  BUN 25*  CREATININE 1.07*  CALCIUM 9.4   ------------------------------------------------------------------------------------------------------------------  Cardiac Enzymes  Recent Labs Lab 06/13/16 1328  TROPONINI <0.03   ------------------------------------------------------------------------------------------------------------------  RADIOLOGY:  Dg Chest 2 View  Result Date: 06/13/2016 CLINICAL DATA:  Fall. EXAM: CHEST  2 VIEW COMPARISON:  Radiographs of September 13, 2012. FINDINGS: The heart size and mediastinal contours are within normal limits. Both lungs are clear. No pneumothorax or pleural effusion is noted. Atherosclerosis of thoracic aorta is noted. Degenerative changes seen involving both glenohumeral joints. Stable old compression fractures are noted in thoracic spine. IMPRESSION: No active cardiopulmonary disease.  Aortic atherosclerosis. Electronically Signed   By: Lupita Raider, M.D.   On: 06/13/2016 16:18   Ct Head Wo Contrast  Result Date: 06/13/2016 CLINICAL DATA:  Syncope with fall. Left upper extremity and neck skin tears. EXAM: CT HEAD WITHOUT CONTRAST CT CERVICAL SPINE WITHOUT CONTRAST TECHNIQUE: Multidetector CT imaging of the head and cervical spine was performed following the standard protocol without intravenous contrast. Multiplanar CT image reconstructions of the cervical spine were also generated. COMPARISON:  08/30/2012 head CT. FINDINGS: CT HEAD FINDINGS Brain: No evidence of parenchymal hemorrhage or  extra-axial fluid collection. No mass lesion, mass effect, or midline shift. No CT evidence of acute infarction. Nonspecific mild subcortical and periventricular white matter hypodensity, most in keeping with chronic small vessel ischemic change. Cerebral volume is age appropriate. No ventriculomegaly. Vascular: No hyperdense vessel or unexpected calcification. Skull: No evidence of calvarial fracture. Sinuses/Orbits: The visualized paranasal sinuses are essentially clear. Other: Small inferior left mastoid effusion. The right mastoid air cells are unopacified. CT CERVICAL SPINE FINDINGS Alignment: Normal cervical lordosis. No subluxation. Dens is well positioned between the lateral masses of C1. Skull base and vertebrae: No acute fracture. No primary bone lesion or focal pathologic process. Soft tissues and spinal canal: No prevertebral fluid or swelling. No visible canal hematoma. Disc levels: Moderate to severe multilevel degenerative disc disease throughout the cervical spine. Severe bilateral facet arthropathy. Mild degenerative foraminal stenosis bilaterally at C3-4. Mild right and moderate left degenerative foraminal stenosis at C4-5. Mild right degenerative foraminal stenosis at C5-6. Upper chest: Symmetric mild biapical pleural-parenchymal scarring. Other: Visualized right mastoid air cells appear clear. Small inferior left mastoid effusion. Heterogeneous thyroid gland without discrete thyroid nodules. No pathologically enlarged cervical nodes. IMPRESSION: 1. No evidence of acute intracranial abnormality. No evidence of calvarial fracture. 2. Mild chronic small vessel ischemia. 3. Nonspecific small left mastoid effusion. 4. No cervical spine fracture or subluxation. 5. Advanced degenerative changes in the cervical spine as described. Electronically Signed   By: Delbert Phenix M.D.   On: 06/13/2016 16:23   Ct Cervical Spine Wo Contrast  Result Date: 06/13/2016  CLINICAL DATA:  Syncope with fall. Left upper  extremity and neck skin tears. EXAM: CT HEAD WITHOUT CONTRAST CT CERVICAL SPINE WITHOUT CONTRAST TECHNIQUE: Multidetector CT imaging of the head and cervical spine was performed following the standard protocol without intravenous contrast. Multiplanar CT image reconstructions of the cervical spine were also generated. COMPARISON:  08/30/2012 head CT. FINDINGS: CT HEAD FINDINGS Brain: No evidence of parenchymal hemorrhage or extra-axial fluid collection. No mass lesion, mass effect, or midline shift. No CT evidence of acute infarction. Nonspecific mild subcortical and periventricular white matter hypodensity, most in keeping with chronic small vessel ischemic change. Cerebral volume is age appropriate. No ventriculomegaly. Vascular: No hyperdense vessel or unexpected calcification. Skull: No evidence of calvarial fracture. Sinuses/Orbits: The visualized paranasal sinuses are essentially clear. Other: Small inferior left mastoid effusion. The right mastoid air cells are unopacified. CT CERVICAL SPINE FINDINGS Alignment: Normal cervical lordosis. No subluxation. Dens is well positioned between the lateral masses of C1. Skull base and vertebrae: No acute fracture. No primary bone lesion or focal pathologic process. Soft tissues and spinal canal: No prevertebral fluid or swelling. No visible canal hematoma. Disc levels: Moderate to severe multilevel degenerative disc disease throughout the cervical spine. Severe bilateral facet arthropathy. Mild degenerative foraminal stenosis bilaterally at C3-4. Mild right and moderate left degenerative foraminal stenosis at C4-5. Mild right degenerative foraminal stenosis at C5-6. Upper chest: Symmetric mild biapical pleural-parenchymal scarring. Other: Visualized right mastoid air cells appear clear. Small inferior left mastoid effusion. Heterogeneous thyroid gland without discrete thyroid nodules. No pathologically enlarged cervical nodes. IMPRESSION: 1. No evidence of acute  intracranial abnormality. No evidence of calvarial fracture. 2. Mild chronic small vessel ischemia. 3. Nonspecific small left mastoid effusion. 4. No cervical spine fracture or subluxation. 5. Advanced degenerative changes in the cervical spine as described. Electronically Signed   By: Delbert Phenix M.D.   On: 06/13/2016 16:23    EKG:   Orders placed or performed during the hospital encounter of 06/13/16  . ED EKG  . ED EKG  . EKG 12-Lead  . EKG 12-Lead  Normal sinus rhythm, no ST-T changes.  IMPRESSION AND PLAN:   #1 .syncope  secondary to mild dehydration but unable to rule out cardiac arrhythmia. Monitor on telemetry overnight, ultrasound of carotids ordered, follow echocardiogram, patient to Lasix is discontinued because of syncope, dehydration.  #2 chronic atrial fibrillation on aspirin alone patient's heart rate is in 60s on metoprolol. He is a dose of metoprolol to 25 mg twice a day.  #3 .chronic gout ;continue allopurinol #4. Anxiety; continue Xanax  Code full   All the records are reviewed and case discussed with ED provider. Management plans discussed with the patient, family and they are in agreement.  CODE STATUS: full  TOTAL TIME TAKING CARE OF THIS PATIENT: 55 minutes.    Katha Hamming M.D on 06/13/2016 at 6:32 PM  Between 7am to 6pm - Pager - [phone]  After 6pm go to [email or web address] - password [email or web address] [facility]  [facility]  Office  [phone]  CC: Primary care physician; FITZGERALD, DAVID Demetrius Charity, MD  Note: This dictation was prepared with Dragon dictation along with smaller phrase technology. Any transcriptional errors that result from this process are unintentional.

## 2016-06-13 NOTE — ED Notes (Signed)
In and out cath complete, bladder emptied. Urine output 500 cc.

## 2016-06-14 ENCOUNTER — Observation Stay: Payer: Medicare Other

## 2016-06-14 ENCOUNTER — Encounter: Payer: Self-pay | Admitting: Emergency Medicine

## 2016-06-14 ENCOUNTER — Observation Stay (HOSPITAL_BASED_OUTPATIENT_CLINIC_OR_DEPARTMENT_OTHER)
Admit: 2016-06-14 | Discharge: 2016-06-14 | Disposition: A | Discharge status: Home / Self Care | Payer: Medicare Other | Attending: Internal Medicine | Admitting: Internal Medicine

## 2016-06-14 DIAGNOSIS — R55 Syncope and collapse: Secondary | ICD-10-CM | POA: Diagnosis not present

## 2016-06-14 LAB — CBC
HEMATOCRIT: 34.1 % — AB (ref 35.0–47.0)
HEMOGLOBIN: 11.4 g/dL — AB (ref 12.0–16.0)
MCH: 29 pg (ref 26.0–34.0)
MCHC: 33.3 g/dL (ref 32.0–36.0)
MCV: 87.1 fL (ref 80.0–100.0)
Platelets: 232 10*3/uL (ref 150–440)
RBC: 3.92 MIL/uL (ref 3.80–5.20)
RDW: 15.8 % — ABNORMAL HIGH (ref 11.5–14.5)
WBC: 7.8 10*3/uL (ref 3.6–11.0)

## 2016-06-14 LAB — BASIC METABOLIC PANEL
ANION GAP: 7 (ref 5–15)
BUN: 26 mg/dL — ABNORMAL HIGH (ref 6–20)
CHLORIDE: 104 mmol/L (ref 101–111)
CO2: 26 mmol/L (ref 22–32)
Calcium: 8.8 mg/dL — ABNORMAL LOW (ref 8.9–10.3)
Creatinine, Ser: 1.15 mg/dL — ABNORMAL HIGH (ref 0.44–1.00)
GFR calc Af Amer: 45 mL/min — ABNORMAL LOW (ref >60)
GFR, EST NON AFRICAN AMERICAN: 39 mL/min — AB (ref >60)
Glucose, Bld: 117 mg/dL — ABNORMAL HIGH (ref 65–99)
POTASSIUM: 3.3 mmol/L — AB (ref 3.5–5.1)
SODIUM: 137 mmol/L (ref 135–145)

## 2016-06-14 LAB — GLUCOSE, CAPILLARY: Glucose-Capillary: 121 mg/dL — ABNORMAL HIGH (ref 65–99)

## 2016-06-14 MED ORDER — POTASSIUM CHLORIDE CRYS ER 20 MEQ PO TBCR
40.0000 meq | EXTENDED_RELEASE_TABLET | Freq: Once | ORAL | Status: AC
  Administered 2016-06-14: 40 meq via ORAL
  Filled 2016-06-14: qty 2

## 2016-06-14 NOTE — Care Management (Signed)
No discharge needs identified by members of care team 

## 2016-06-14 NOTE — Discharge Summary (Signed)
Jennifer [name], 83 y.o., DOB 1917-02-23, MRN [id number]. Admission date: 06/13/2016 Discharge Date 06/14/2016 Primary MD Mick Sell, MD Admitting Physician Auburn Bilberry, MD  Admission Diagnosis  Cough [R05] Dizziness [R42] Congestion of upper airway [J98.8] Syncope, unspecified syncope type [R55]  Discharge Diagnosis   Active Problems:   Syncope Dehydration [facility] Course  Thaila Bottoms  is a 83 y.o. female with a known history of chronic fibrillations, essential hypertension comes in because of syncope. Patient felt lightheaded this afternoon, she went to get something from kitchen and then next thing she noticed. She passed out, and patient's grandson noticed her in the kitchen after  2 min  regained her consciousness.  Patient noted to be hypotensive. She was given IVF and bp normalized.  Pt feeling better and was seen by pt who recommend out patient pt.             Consults  None  Significant Tests:  See full reports for all details     Dg Chest 2 View  Result Date: 06/13/2016 CLINICAL DATA:  Fall. EXAM: CHEST  2 VIEW COMPARISON:  Radiographs of September 13, 2012. FINDINGS: The heart size and mediastinal contours are within normal limits. Both lungs are clear. No pneumothorax or pleural effusion is noted. Atherosclerosis of thoracic aorta is noted. Degenerative changes seen involving both glenohumeral joints. Stable old compression fractures are noted in thoracic spine. IMPRESSION: No active cardiopulmonary disease.  Aortic atherosclerosis. Electronically Signed   By: Lupita Raider, M.D.   On: 06/13/2016 16:18   Ct Head Wo Contrast  Result Date: 06/13/2016 CLINICAL DATA:  Syncope with fall. Left upper extremity and neck skin tears. EXAM: CT HEAD WITHOUT CONTRAST CT CERVICAL SPINE WITHOUT CONTRAST TECHNIQUE: Multidetector CT imaging of the head and cervical spine was performed following the standard  protocol without intravenous contrast. Multiplanar CT image reconstructions of the cervical spine were also generated. COMPARISON:  08/30/2012 head CT. FINDINGS: CT HEAD FINDINGS Brain: No evidence of parenchymal hemorrhage or extra-axial fluid collection. No mass lesion, mass effect, or midline shift. No CT evidence of acute infarction. Nonspecific mild subcortical and periventricular white matter hypodensity, most in keeping with chronic small vessel ischemic change. Cerebral volume is age appropriate. No ventriculomegaly. Vascular: No hyperdense vessel or unexpected calcification. Skull: No evidence of calvarial fracture. Sinuses/Orbits: The visualized paranasal sinuses are essentially clear. Other: Small inferior left mastoid effusion. The right mastoid air cells are unopacified. CT CERVICAL SPINE FINDINGS Alignment: Normal cervical lordosis. No subluxation. Dens is well positioned between the lateral masses of C1. Skull base and vertebrae: No acute fracture. No primary bone lesion or focal pathologic process. Soft tissues and spinal canal: No prevertebral fluid or swelling. No visible canal hematoma. Disc levels: Moderate to severe multilevel degenerative disc disease throughout the cervical spine. Severe bilateral facet arthropathy. Mild degenerative foraminal stenosis bilaterally at C3-4. Mild right and moderate left degenerative foraminal stenosis at C4-5. Mild right degenerative foraminal stenosis at C5-6. Upper chest: Symmetric mild biapical pleural-parenchymal scarring. Other: Visualized right mastoid air cells appear clear. Small inferior left mastoid effusion. Heterogeneous thyroid gland without discrete thyroid nodules. No pathologically enlarged cervical nodes. IMPRESSION: 1. No evidence of acute intracranial abnormality. No evidence of calvarial fracture. 2. Mild chronic small vessel ischemia. 3. Nonspecific small left mastoid effusion. 4. No cervical spine fracture or subluxation. 5. Advanced  degenerative changes in the cervical spine  as described. Electronically Signed   By: Delbert PhenixJason A Poff M.D.   On: 06/13/2016 16:23   Ct Cervical Spine Wo Contrast  Result Date: 06/13/2016 CLINICAL DATA:  Syncope with fall. Left upper extremity and neck skin tears. EXAM: CT HEAD WITHOUT CONTRAST CT CERVICAL SPINE WITHOUT CONTRAST TECHNIQUE: Multidetector CT imaging of the head and cervical spine was performed following the standard protocol without intravenous contrast. Multiplanar CT image reconstructions of the cervical spine were also generated. COMPARISON:  08/30/2012 head CT. FINDINGS: CT HEAD FINDINGS Brain: No evidence of parenchymal hemorrhage or extra-axial fluid collection. No mass lesion, mass effect, or midline shift. No CT evidence of acute infarction. Nonspecific mild subcortical and periventricular white matter hypodensity, most in keeping with chronic small vessel ischemic change. Cerebral volume is age appropriate. No ventriculomegaly. Vascular: No hyperdense vessel or unexpected calcification. Skull: No evidence of calvarial fracture. Sinuses/Orbits: The visualized paranasal sinuses are essentially clear. Other: Small inferior left mastoid effusion. The right mastoid air cells are unopacified. CT CERVICAL SPINE FINDINGS Alignment: Normal cervical lordosis. No subluxation. Dens is well positioned between the lateral masses of C1. Skull base and vertebrae: No acute fracture. No primary bone lesion or focal pathologic process. Soft tissues and spinal canal: No prevertebral fluid or swelling. No visible canal hematoma. Disc levels: Moderate to severe multilevel degenerative disc disease throughout the cervical spine. Severe bilateral facet arthropathy. Mild degenerative foraminal stenosis bilaterally at C3-4. Mild right and moderate left degenerative foraminal stenosis at C4-5. Mild right degenerative foraminal stenosis at C5-6. Upper chest: Symmetric mild biapical pleural-parenchymal scarring. Other:  Visualized right mastoid air cells appear clear. Small inferior left mastoid effusion. Heterogeneous thyroid gland without discrete thyroid nodules. No pathologically enlarged cervical nodes. IMPRESSION: 1. No evidence of acute intracranial abnormality. No evidence of calvarial fracture. 2. Mild chronic small vessel ischemia. 3. Nonspecific small left mastoid effusion. 4. No cervical spine fracture or subluxation. 5. Advanced degenerative changes in the cervical spine as described. Electronically Signed   By: Delbert PhenixJason A Poff M.D.   On: 06/13/2016 16:23   [place] Carotid Bilateral  Result Date: 06/14/2016 CLINICAL DATA:  Dizziness.  Syncope. EXAM: BILATERAL CAROTID DUPLEX ULTRASOUND TECHNIQUE: Wallace CullensGray scale imaging, color Doppler and duplex ultrasound were performed of bilateral carotid and vertebral arteries in the neck. COMPARISON:  None. FINDINGS: Criteria: Quantification of carotid stenosis is based on velocity parameters that correlate the residual internal carotid diameter with NASCET-based stenosis levels, using the diameter of the distal internal carotid lumen as the denominator for stenosis measurement. The following velocity measurements were obtained: RIGHT ICA:  96 cm/sec CCA:  84 cm/sec SYSTOLIC ICA/CCA RATIO:  1.1 DIASTOLIC ICA/CCA RATIO:  2.4 ECA:  97 cm/sec LEFT ICA:  106 cm/sec CCA:  78 cm/sec SYSTOLIC ICA/CCA RATIO:  1.4 DIASTOLIC ICA/CCA RATIO:  2.7 ECA:  101 cm/sec RIGHT CAROTID ARTERY: Moderate calcified plaque in the bulb. This does somewhat shadow the lumen. Low resistance internal carotid Doppler pattern. RIGHT VERTEBRAL ARTERY:  Antegrade. LEFT CAROTID ARTERY: There is moderate focal calcified plaque in the bulb. Low resistance internal carotid Doppler pattern is preserved. LEFT VERTEBRAL ARTERY:  Antegrade. IMPRESSION: Less than 50% stenosis in the right and left internal carotid arteries. There is moderate calcified plaque in the right and left carotid bulbs. Electronically Signed   By: Jolaine ClickArthur   Hoss M.D.   On: 06/14/2016 12:43       Today   Subjective:   Darin Engelsgnes Vittorio  feeling better denies any complaints  Objective:   Blood pressure Marland Kitchen(!)  98/44, pulse (!) 46, temperature 97.8 F (36.6 C), temperature source Oral, resp. rate 20, height 5\' 6"  (1.676 m), weight 103 lb 6.4 oz (46.9 kg), SpO2 96 %.  .  Intake/Output Summary (Last 24 hours) at 06/14/16 1641 Last data filed at 06/14/16 1502  Gross per 24 hour  Intake           764.17 ml  Output              500 ml  Net           264.17 ml    Exam VITAL SIGNS: Blood pressure (!) 98/44, pulse (!) 46, temperature 97.8 F (36.6 C), temperature source Oral, resp. rate 20, height 5\' 6"  (1.676 m), weight 103 lb 6.4 oz (46.9 kg), SpO2 96 %.  GENERAL:  83 y.o.-year-old patient lying in the bed with no acute distress.  EYES: Pupils equal, round, reactive to light and accommodation. No scleral icterus. Extraocular muscles intact.  HEENT: Head atraumatic, normocephalic. Oropharynx and nasopharynx clear.  NECK:  Supple, no jugular venous distention. No thyroid enlargement, no tenderness.  LUNGS: Normal breath sounds bilaterally, no wheezing, rales,rhonchi or crepitation. No use of accessory muscles of respiration.  CARDIOVASCULAR: S1, S2 normal. No murmurs, rubs, or gallops.  ABDOMEN: Soft, nontender, nondistended. Bowel sounds present. No organomegaly or mass.  EXTREMITIES: No pedal edema, cyanosis, or clubbing.  NEUROLOGIC: Cranial nerves II through XII are intact. Muscle strength 5/5 in all extremities. Sensation intact. Gait not checked.  PSYCHIATRIC: The patient is alert and oriented x 3.  SKIN: No obvious rash, lesion, or ulcer.   Data Review     CBC w Diff: Lab Results  Component Value Date   WBC 7.8 06/14/2016   HGB 11.4 (L) 06/14/2016   HGB 10.3 (L) 04/28/2013   HCT 34.1 (L) 06/14/2016   HCT 29.4 (L) 04/27/2013   PLT 232 06/14/2016   PLT 180 04/27/2013   LYMPHOPCT 34.8 04/27/2013   MONOPCT 10.2 04/27/2013   EOSPCT  3.9 04/27/2013   BASOPCT 0.4 04/27/2013   CMP: Lab Results  Component Value Date   NA 137 06/14/2016   NA 138 04/26/2013   K 3.3 (L) 06/14/2016   K 3.6 04/26/2013   CL 104 06/14/2016   CL 101 04/26/2013   CO2 26 06/14/2016   CO2 29 04/26/2013   BUN 26 (H) 06/14/2016   BUN 29 (H) 04/26/2013   CREATININE 1.15 (H) 06/14/2016   CREATININE 0.98 04/26/2013   PROT 6.4 04/26/2013   ALBUMIN 3.1 (L) 04/26/2013   BILITOT 0.3 04/26/2013   ALKPHOS 154 (H) 04/26/2013   AST 24 04/26/2013   ALT 17 04/26/2013  .  Micro Results Recent Results (from the past 240 hour(s))  Rapid Influenza A&B Antigens ([facility] only)     Status: None   Collection Time: 06/13/16  4:23 PM  Result Value Ref Range Status   Influenza A ([facility]) NEGATIVE NEGATIVE Final   Influenza B [facility]) NEGATIVE NEGATIVE Final        Code Status Orders        Start     Ordered   06/13/16 1805  Full code  Continuous     06/13/16 1806    Code Status History    Date Active Date Inactive Code Status Order ID Comments User Context   This patient has a current code status but no historical code status.          Follow-up Information    FITZGERALD, DAVID P,  MD Follow up in 7 day(s).   Specialty:  Infectious Diseases Contact information: [address] [facility] [place] [address] [phone]           Discharge Medications   Allergies as of 06/14/2016   No Known Allergies     Medication List    STOP taking these medications   furosemide 40 MG tablet Commonly known as:  LASIX   metoprolol 100 MG tablet Commonly known as:  LOPRESSOR     TAKE these medications   allopurinol 300 MG tablet Commonly known as:  ZYLOPRIM Take 300 mg by mouth daily.   ALPRAZolam 0.25 MG tablet Commonly known as:  XANAX Take 1 tablet by mouth every 8 (eight) hours as needed.   aspirin EC 81 MG tablet Take 81 mg by mouth daily.   KLOR-CON 10 10 MEQ tablet Generic drug:   potassium chloride Take 1 tablet by mouth 2 (two) times daily.   omeprazole 20 MG capsule Commonly known as:  PRILOSEC Take 1 capsule by mouth daily.          Total Time in preparing paper work, data evaluation and todays exam - 35 minutes  Auburn Bilberry M.D on 06/14/2016 at 4:41 PM  [facility]   Office  [phone]

## 2016-06-14 NOTE — Evaluation (Signed)
Physical Therapy Evaluation Patient Details Name: Jake Seatsgnes M Terriquez MRN: [id number] DOB: 01/09/1917 Today's Date: 06/14/2016   History of Present Illness  Pt is a 83 yo female with an episode of syncope at home on 06/13/16. Patient was reaching for kitchenware on a high shelf, felt light headed, and loss consciousness. This resulted in a fall with resulted in skin laceration on her neck; patient was taken to the hospital after the fall. Patient currently lives with daughter at a private residence. Patient ambulates with a FWW at home and reports no difficulty with amb.   Clinical Impression  Pt demonstrates independence with bed mobility and modified independence with transfers/amb with use of FWW. Patient is HOH and demonstrates difficulty with following direction and cueing secondary to hearing limitations. All LE strength measurements taken are WNL; patient demonstrates increased difficulty balancing without use of FWW. Recommend discharge home with OP PT for follow up to address balancing issues.    Follow Up Recommendations Outpatient PT (Outpatient PT for balance and walking difficulties)    Equipment Recommendations  Rolling walker with 5" wheels    Recommendations for Other Services       Precautions / Restrictions Precautions Precautions: Fall Restrictions Weight Bearing Restrictions: No      Mobility  Bed Mobility Overal bed mobility: Independent             General bed mobility comments: Patient able to transfer from supine<->sit without assistance.   Transfers Overall transfer level: Independent Equipment used: Rolling walker (2 wheeled)             General transfer comment: Patient performs sit to stand with use of UE's; patient unwilling to attempt transfer without use of UEs  Ambulation/Gait Ambulation/Gait assistance: Modified independent (Device/Increase time) Ambulation Distance (Feet): 160 Feet Assistive device: Rolling walker (2 wheeled) Gait  Pattern/deviations: Decreased step length - right;Decreased step length - left;Decreased stride length;Decreased stance time - left   Gait velocity interpretation: Below normal speed for age/gender General Gait Details: Patient demonstrates decreased step length/stride length B with slow cadence.  Stairs            Wheelchair Mobility    Modified Rankin (Stroke Patients Only)       Balance Overall balance assessment: Modified Independent                                           Pertinent Vitals/Pain Pain Assessment: No/denies pain    Home Living Family/patient expects to be discharged to:: Private residence Living Arrangements: Children;Other relatives Available Help at Discharge: Family Type of Home: House Home Access: Stairs to enter Entrance Stairs-Rails: Right Entrance Stairs-Number of Steps: 2 Home Layout: One level Home Equipment: Walker - 2 wheels;Bedside commode;Tub bench      Prior Function Level of Independence: Independent with assistive device(s)         Comments: Independent with amb with FWW and ability to perform transfers with modI with assist of walker     Hand Dominance        Extremity/Trunk Assessment   Upper Extremity Assessment Upper Extremity Assessment: Overall WFL for tasks assessed    Lower Extremity Assessment Lower Extremity Assessment: Overall WFL for tasks assessed       Communication   Communication: No difficulties  Cognition Arousal/Alertness: Awake/alert Behavior During Therapy: WFL for tasks assessed/performed Overall Cognitive Status: Within Functional Limits for tasks  assessed                 General Comments: AOx4    General Comments General comments (skin integrity, edema, etc.): Patient able to balance without UE support with feet at shoulder width apart.    Exercises Total Joint Exercises Long Arc Quad: AROM;Both;10 reps;Seated Marching in Standing: AROM;10  reps;Standing;Both Other Exercises Other Exercises: Performed x5 sit to stands with use of FWW to assist with standing   Assessment/Plan    PT Assessment Patient needs continued PT services  PT Problem List Decreased strength;Decreased mobility;Decreased balance          PT Treatment Interventions Gait training;Stair training;Therapeutic activities;Therapeutic exercise    PT Goals (Current goals can be found in the Care Plan section)  Acute Rehab PT Goals Patient Stated Goal: To return to home. PT Goal Formulation: With patient Time For Goal Achievement: 06/28/16 Potential to Achieve Goals: Good    Frequency Min 2X/week   Barriers to discharge        Co-evaluation               End of Session Equipment Utilized During Treatment: Gait belt Activity Tolerance: Patient tolerated treatment well Patient left: in bed (transport came at end of session to transfer patient to ultrasound)      Functional Assessment Tool Used: functional mobility; gait speed; clinical reasoning Functional Limitation: Mobility: Walking and moving around Mobility: Walking and Moving Around Current Status ([id number]): At least 20 percent but less than 40 percent impaired, limited or restricted Mobility: Walking and Moving Around Goal Status [phone]): At least 20 percent but less than 40 percent impaired, limited or restricted    Time: 1030-1057 PT Time Calculation (min) (ACUTE ONLY): 27 min   Charges:   PT Evaluation $PT Eval Low Complexity: 1 Procedure PT Treatments $Gait Training: 8-22 mins $Therapeutic Exercise: 8-22 mins   PT G Codes:   PT G-Codes **NOT FOR INPATIENT CLASS** Functional Assessment Tool Used: functional mobility; gait speed; clinical reasoning Functional Limitation: Mobility: Walking and moving around Mobility: Walking and Moving Around Current Status (U9811): At least 20 percent but less than 40 percent impaired, limited or restricted Mobility: Walking and Moving Around  Goal Status [phone]): At least 20 percent but less than 40 percent impaired, limited or restricted    Myrene Galas, PT DPT 06/14/2016, 11:22 AM

## 2016-06-14 NOTE — Plan of Care (Signed)
Problem: Acute Rehab PT Goals(only PT should resolve) Goal: Pt Will Go Supine/Side To Sit Patient will be able to transfer from supine to sit independently to be able to self-adjust body position at home.  Goal: Patient Will Transfer Sit To/From Stand Patient will be able to transfer from sit <-> stand modI in order to stand up from using the toilet.  Goal: Pt Will Ambulate Outcome: Progressing Patient will be able to ambulate with FWW modI to demonstrate ability to walk to the restroom at home.  Goal: Pt Will Go Up/Down Stairs Patient will be able to ascend/descend two stairs modI to enter home safely.

## 2016-06-14 NOTE — Care Management Obs Status (Signed)
MEDICARE OBSERVATION STATUS NOTIFICATION   Patient Details  Name: Jennifer [name] MRN: [id number] Date of Birth: 06/12/1916   Medicare Observation Status Notification Given:  Yes  Has not discharged yet- even though order has been present.    Eber HongGreene, Zorion Nims R, RN 06/14/2016, 5:19 PM

## 2016-06-14 NOTE — Discharge Instructions (Signed)
[employer] [facility] [facility] at [facility]  DIET:  Cardiac diet  DISCHARGE CONDITION:  Stable  ACTIVITY:  Activity as tolerated  OXYGEN:  Home Oxygen: No.   Oxygen Delivery: room air  DISCHARGE LOCATION:  home    ADDITIONAL DISCHARGE INSTRUCTION: dont take lasix or metoprolol until seen by primary md   If you experience worsening of your admission symptoms, develop shortness of breath, life threatening emergency, suicidal or homicidal thoughts you must seek medical attention immediately by calling 911 or calling your MD immediately  if symptoms less severe.  You Must read complete instructions/literature along with all the possible adverse reactions/side effects for all the Medicines you take and that have been prescribed to you. Take any new Medicines after you have completely understood and accpet all the possible adverse reactions/side effects.   Please note  You were cared for by a hospitalist during your hospital stay. If you have any questions about your discharge medications or the care you received while you were in the hospital after you are discharged, you can call the unit and asked to speak with the hospitalist on call if the hospitalist that took care of you is not available. Once you are discharged, your primary care physician will handle any further medical issues. Please note that NO REFILLS for any discharge medications will be authorized once you are discharged, as it is imperative that you return to your primary care physician (or establish a relationship with a primary care physician if you do not have one) for your aftercare needs so that they can reassess your need for medications and monitor your lab values.

## 2016-06-14 NOTE — Care Management Obs Status (Signed)
MEDICARE OBSERVATION STATUS NOTIFICATION   Patient Details  Name: Jennifer [name] MRN: [id number] Date of Birth: 07/28/1916   Medicare Observation Status Notification Given:  No < 24 hours   Eber HongGreene, Cataleia Gade R, RN 06/14/2016, 10:18 AM

## 2016-06-15 LAB — ECHOCARDIOGRAM COMPLETE
AV Area mean vel: 1.4 cm2
AV Mean grad: 6 mmHg
AV Peak grad: 12 mmHg
AV area mean vel ind: 0.95 cm2/m2
AV peak Index: 1.14
AV vel: 1.9
AVAREAVTI: 1.67 cm2
AVAREAVTIIND: 1.3 cm2/m2
AVCELMEANRAT: 0.49
AVPKVEL: 172 cm/s
Ao pk vel: 0.59 m/s
CHL CUP AV VALUE AREA INDEX: 1.3
CHL CUP DOP CALC LVOT VTI: 24.1 cm
DOP CAL AO MEAN VELOCITY: 120 cm/s
EWDT: 187 ms
FS: 57 % — AB (ref 28–44)
HEIGHTINCHES: 66 in
IVS/LV PW RATIO, ED: 1.09
LA diam end sys: 40 mm
LA diam index: 2.73 cm/m2
LA vol A4C: 70 ml
LASIZE: 40 mm
LDCA: 2.84 cm2
LVOT peak VTI: 0.67 cm
LVOTD: 19 mm
LVOTPV: 101 cm/s
LVOTSV: 68 mL
MV Dec: 187
MV Peak grad: 6 mmHg
MV pk A vel: 148 m/s
MVPKEVEL: 119 m/s
PW: 7.5 mm — AB (ref 0.6–1.1)
RV LATERAL S' VELOCITY: 10.4 cm/s
TAPSE: 16.1 mm
VTI: 36.1 cm
Valve area: 1.9 cm2
WEIGHTICAEL: 1654.4 [oz_av]

## 2018-02-17 IMAGING — CT CT HEAD W/O CM
4 of 7 series · 13 of 47 positions shown, 14 images · non-contrast
Comparison: 08/30/2012 head CT.

CLINICAL DATA: Syncope with fall. Left upper extremity and neck
skin tears.

EXAM:
CT HEAD WITHOUT CONTRAST
CT CERVICAL SPINE WITHOUT CONTRAST
TECHNIQUE: Multidetector CT imaging of the head and cervical spine was
performed following the standard protocol without intravenous
contrast. Multiplanar CT image reconstructions of the cervical spine
were also generated.

[Series 2: head wo · axial · 0.40mm/px · z∈[-27,+18]mm · 2 of 29 slices shown, 3 images]
[im 10/29  brain]
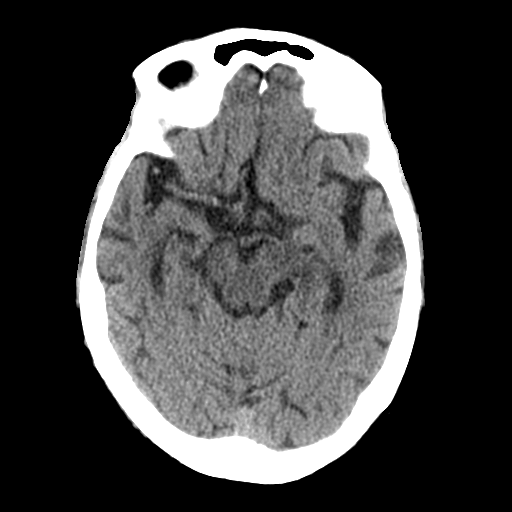
[im 10/29  bone]
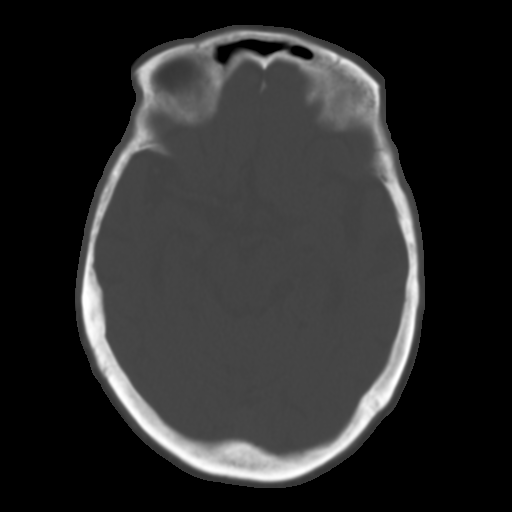
[im 19/29  brain]
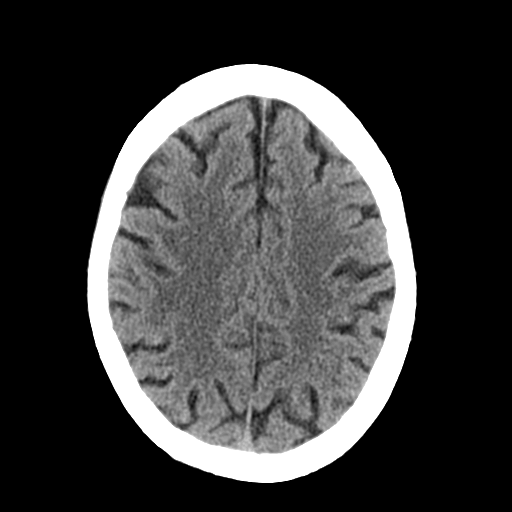

[Series 4: coronal soft tissue · coronal · 0.29mm/px · 3 of 70 slices shown]
[im 24/70  brain]
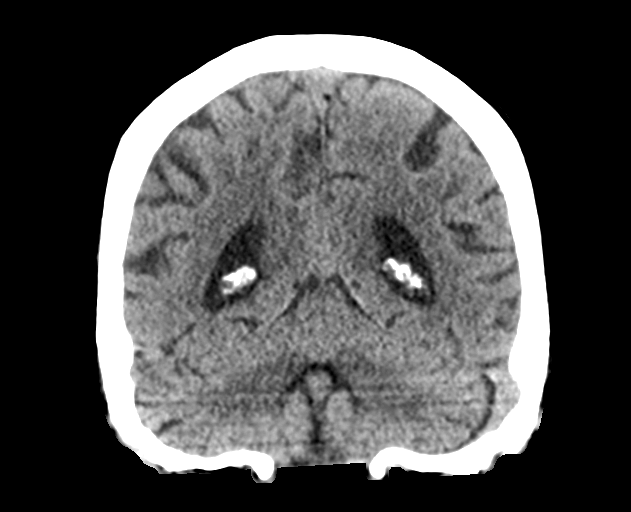
[im 35/70  brain]
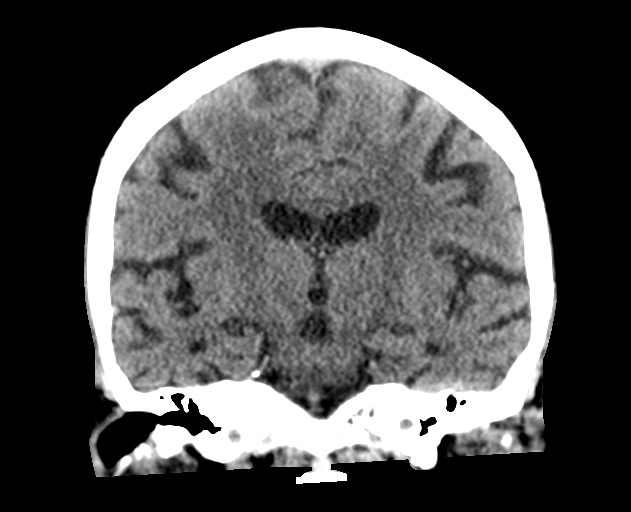
[im 47/70  brain]
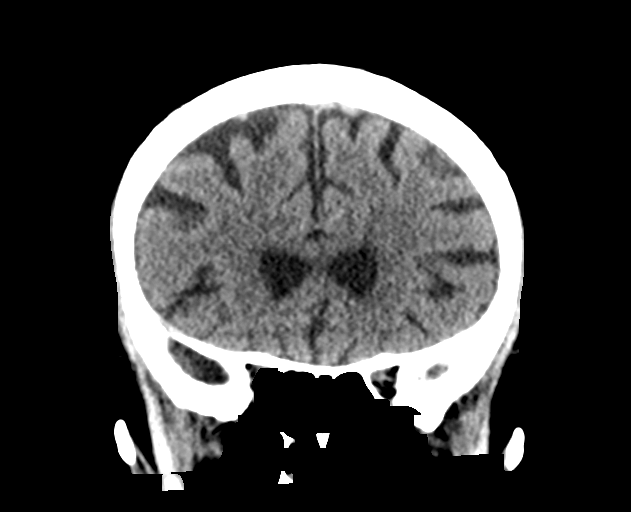

[Series 5: sagittal soft tissue · sagittal · 0.31mm/px · 1 of 52 slices shown]
[im 26/52  brain]
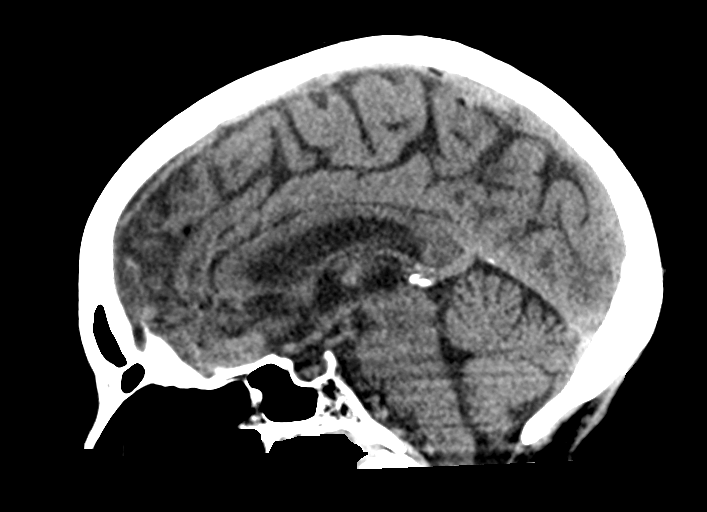

[Series 10: orthogonal bone · axial · 0.23mm/px · z∈[-264,-115]mm · 7 of 112 slices shown]
[im 9/112  bone]
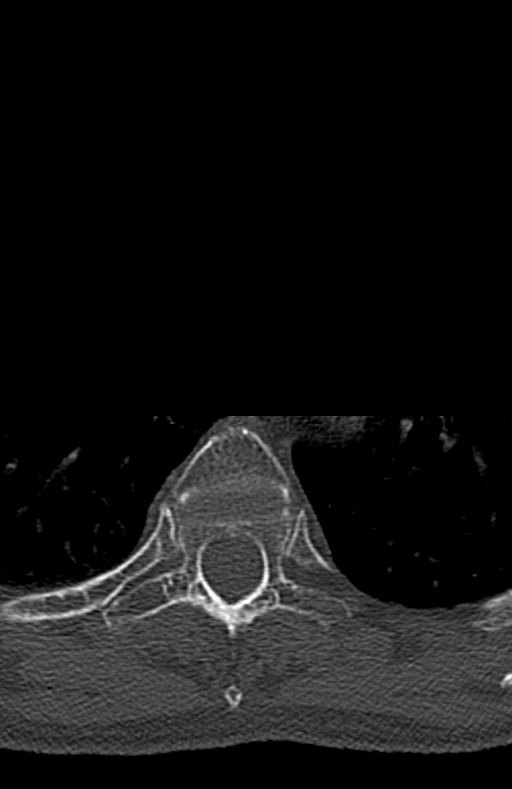
[im 26/112  bone]
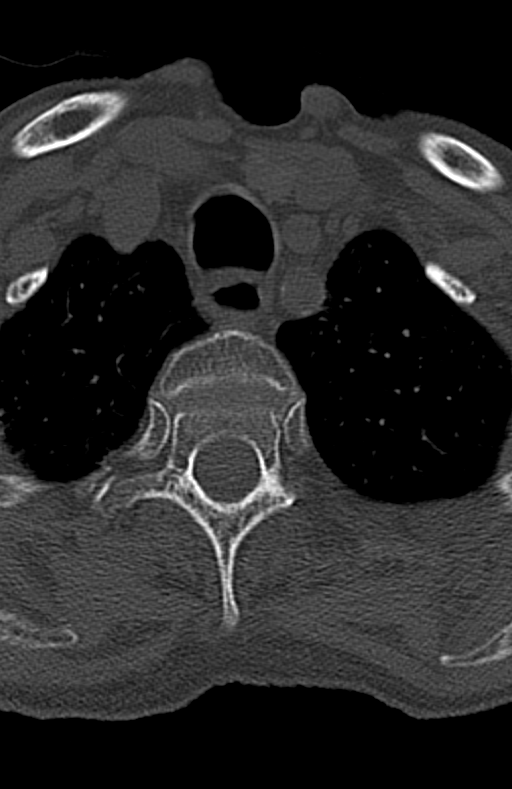
[im 35/112  bone]
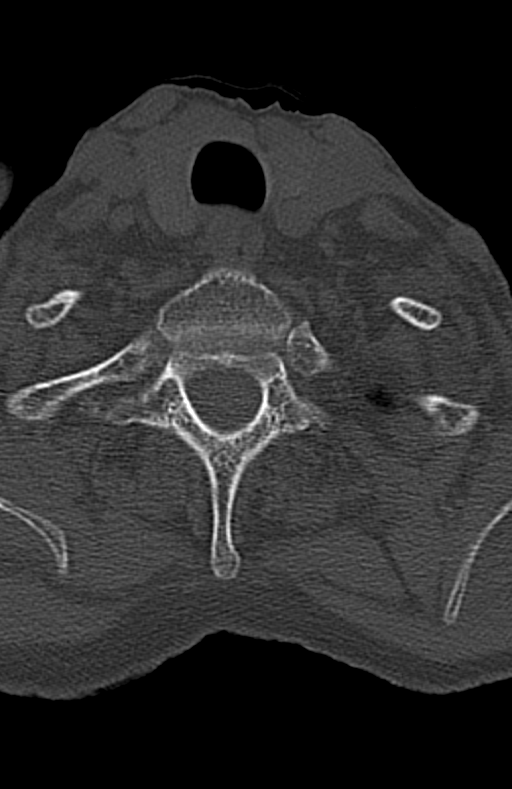
[im 52/112  bone]
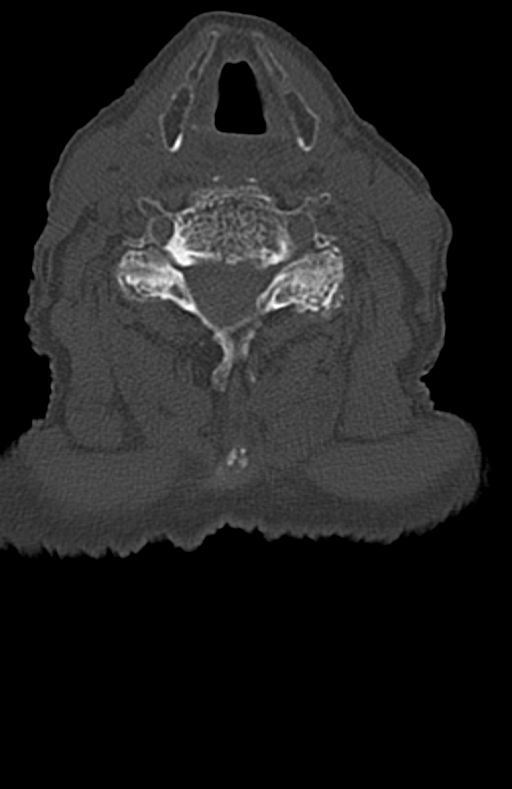
[im 60/112  bone]
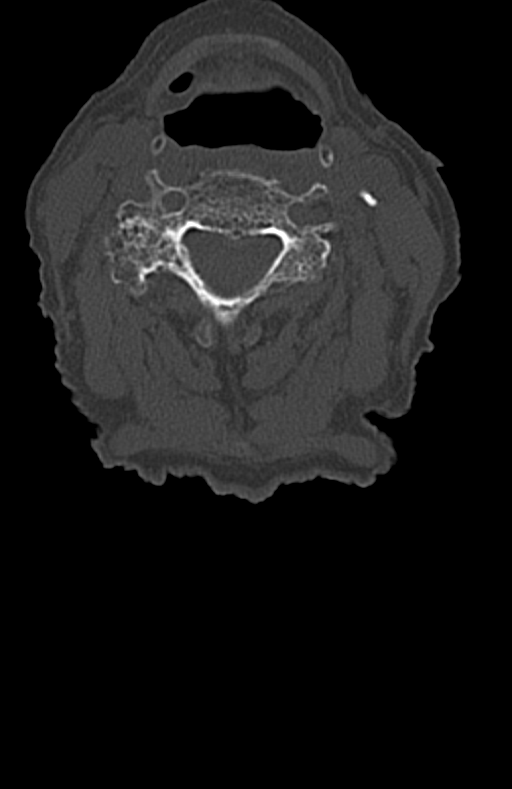
[im 77/112  bone]
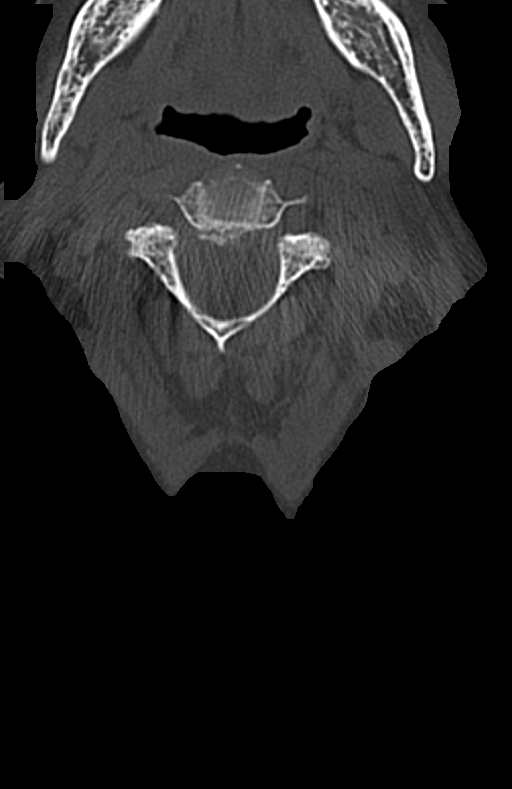
[im 86/112  bone]
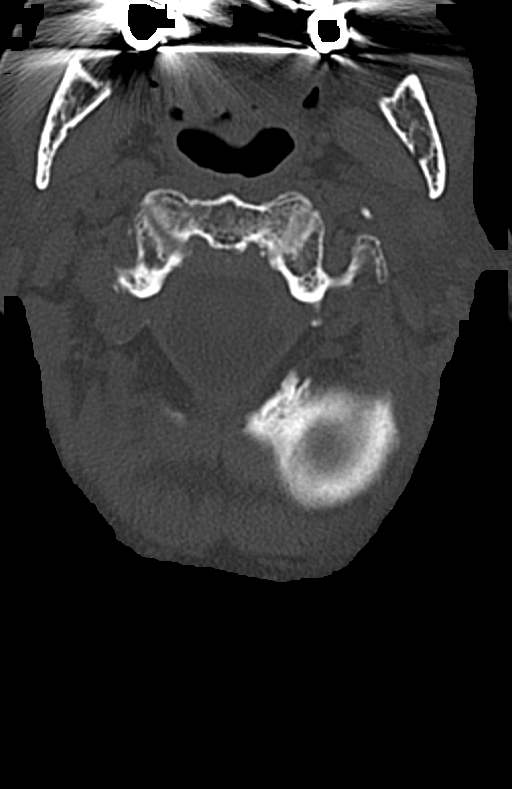

[13 of 47 positions shown; findings below may reference images not displayed]

FINDINGS: CT HEAD FINDINGS

Brain: No evidence of parenchymal hemorrhage or extra-axial fluid
collection. No mass lesion, mass effect, or midline shift. No CT
evidence of acute infarction. Nonspecific mild subcortical and
periventricular white matter hypodensity, most in keeping with
chronic small vessel ischemic change. Cerebral volume is age
appropriate. No ventriculomegaly.

Vascular: No hyperdense vessel or unexpected calcification.

Skull: No evidence of calvarial fracture.

Sinuses/Orbits: The visualized paranasal sinuses are essentially
clear.

Other: Small inferior left mastoid effusion. The right mastoid air
cells are unopacified.

CT CERVICAL SPINE FINDINGS

Alignment: Normal cervical lordosis. No subluxation. Dens is well
positioned between the lateral masses of C1.

Skull base and vertebrae: No acute fracture. No primary bone lesion
or focal pathologic process.

Soft tissues and spinal canal: No prevertebral fluid or swelling. No
visible canal hematoma.

Disc levels: Moderate to severe multilevel degenerative disc disease
throughout the cervical spine. Severe bilateral facet arthropathy.
Mild degenerative foraminal stenosis bilaterally at C3-4. Mild right
and moderate left degenerative foraminal stenosis at C4-5. Mild
right degenerative foraminal stenosis at C5-6.

Upper chest: Symmetric mild biapical pleural-parenchymal scarring.

Other: Visualized right mastoid air cells appear clear. Small
inferior left mastoid effusion. Heterogeneous thyroid gland without
discrete thyroid nodules. No pathologically enlarged cervical nodes.
IMPRESSION: 1. No evidence of acute intracranial abnormality. No evidence of
calvarial fracture.
2. Mild chronic small vessel ischemia.
3. Nonspecific small left mastoid effusion.
4. No cervical spine fracture or subluxation.
5. Advanced degenerative changes in the cervical spine as described.

## 2018-03-14 ENCOUNTER — Encounter: Payer: Self-pay | Admitting: Emergency Medicine

## 2018-03-14 ENCOUNTER — Emergency Department
Admission: EM | Admit: 2018-03-14 | Discharge: 2018-03-14 | Disposition: A | Discharge status: Home / Self Care | Payer: Medicare Other | Attending: Emergency Medicine | Admitting: Emergency Medicine

## 2018-03-14 ENCOUNTER — Emergency Department: Payer: Medicare Other

## 2018-03-14 DIAGNOSIS — Z7982 Long term (current) use of aspirin: Secondary | ICD-10-CM | POA: Diagnosis not present

## 2018-03-14 DIAGNOSIS — R42 Dizziness and giddiness: Secondary | ICD-10-CM | POA: Diagnosis not present

## 2018-03-14 DIAGNOSIS — R079 Chest pain, unspecified: Secondary | ICD-10-CM | POA: Diagnosis not present

## 2018-03-14 LAB — CBC
HCT: 34.9 % — ABNORMAL LOW (ref 35.0–47.0)
HEMOGLOBIN: 11.1 g/dL — AB (ref 12.0–16.0)
MCH: 26.6 pg (ref 26.0–34.0)
MCHC: 31.7 g/dL — ABNORMAL LOW (ref 32.0–36.0)
MCV: 83.8 fL (ref 80.0–100.0)
PLATELETS: 230 10*3/uL (ref 150–440)
RBC: 4.16 MIL/uL (ref 3.80–5.20)
RDW: 16.4 % — ABNORMAL HIGH (ref 11.5–14.5)
WBC: 4.9 10*3/uL (ref 3.6–11.0)

## 2018-03-14 LAB — BASIC METABOLIC PANEL
ANION GAP: 8 (ref 5–15)
BUN: 21 mg/dL (ref 8–23)
CALCIUM: 8.8 mg/dL — AB (ref 8.9–10.3)
CO2: 27 mmol/L (ref 22–32)
CREATININE: 0.93 mg/dL (ref 0.44–1.00)
Chloride: 105 mmol/L (ref 98–111)
GFR, EST AFRICAN AMERICAN: 57 mL/min — AB (ref >60)
GFR, EST NON AFRICAN AMERICAN: 50 mL/min — AB (ref >60)
Glucose, Bld: 105 mg/dL — ABNORMAL HIGH (ref 70–99)
Potassium: 3.6 mmol/L (ref 3.5–5.1)
SODIUM: 140 mmol/L (ref 135–145)

## 2018-03-14 LAB — TROPONIN I

## 2018-03-14 MED ORDER — ASPIRIN 81 MG PO CHEW
324.0000 mg | CHEWABLE_TABLET | Freq: Once | ORAL | Status: AC
  Administered 2018-03-14: 324 mg via ORAL
  Filled 2018-03-14: qty 4

## 2018-03-14 MED ORDER — MECLIZINE HCL 25 MG PO TABS
12.5000 mg | ORAL_TABLET | Freq: Once | ORAL | Status: AC
  Administered 2018-03-14: 12.5 mg via ORAL
  Filled 2018-03-14: qty 1

## 2018-03-14 MED ORDER — LORAZEPAM 2 MG/ML IJ SOLN
0.2500 mg | Freq: Once | INTRAMUSCULAR | Status: AC
  Administered 2018-03-14: 0.25 mg via INTRAVENOUS
  Filled 2018-03-14: qty 1

## 2018-03-14 MED ORDER — MECLIZINE HCL 12.5 MG PO TABS: 12.5000 mg | ORAL_TABLET | Freq: Two times a day (BID) | ORAL | 0 refills | Status: AC

## 2018-03-14 NOTE — ED Triage Notes (Signed)
Pt arrived via ems from home with complaints of chest pain that started this am. Pt reports the pain feels like heaviness in the center of her chest.

## 2018-03-14 NOTE — ED Provider Notes (Signed)
[facility] Emergency Department Provider Note   ____________________________________________   First MD Initiated Contact with Patient 03/14/18 1140     (approximate)  I have reviewed the triage vital signs and the nursing notes.   HISTORY  Chief Complaint Chest Pain    HPI Jennifer [name] is a 84 y.o. female who comes in complaining of vertigo and chest pain.  She said when she got up this morning she began having everything spinning around.  This is still present if she moves.  If she is not moving does not present.  She does not complain of any nausea.  She is not vomiting.  She also complains of some pain in the chest.  She cannot describe what this feels like to me.  Chest pain seems to have subsided on the way here.  Vertigo improved on the way here.  She is only past history is A. fib.  She takes aspirin.   Past Medical History:  Diagnosis Date  . A-fib [facility])     Patient Active Problem List   Diagnosis Date Noted  . Syncope 06/13/2016    History reviewed. No pertinent surgical history.  Prior to Admission medications   Medication Sig Start Date End Date Taking? Authorizing Provider  allopurinol (ZYLOPRIM) 300 MG tablet Take 300 mg by mouth daily.    [provider]  ALPRAZolam Prudy Feeler) 0.25 MG tablet Take 1 tablet by mouth every 8 (eight) hours as needed. 06/01/16   [provider]  aspirin EC 81 MG tablet Take 81 mg by mouth daily.    [provider]  KLOR-CON 10 10 MEQ tablet Take 1 tablet by mouth 2 (two) times daily. 04/03/16   [provider]  omeprazole (PRILOSEC) 20 MG capsule Take 1 capsule by mouth daily. 04/21/16   [provider]    Allergies Patient has no known allergies.  No family history on file.  Social History Social History   Tobacco Use  . Smoking status: Never Smoker  . Smokeless tobacco: Never Used  Substance Use Topics  . Alcohol use: No  . Drug use: No     Review of Systems  Constitutional: No fever/chills Eyes: No visual changes. ENT: No sore throat. Cardiovascular:chest pain. Respiratory: Denies shortness of breath. Gastrointestinal: No abdominal pain.  No nausea, no vomiting.  No diarrhea.  No constipation. Genitourinary: Negative for dysuria. Musculoskeletal: Negative for back pain. Skin: Negative for rash. Neurological: Negative for headaches, focal weakness   ____________________________________________   PHYSICAL EXAM:  VITAL SIGNS: ED Triage Vitals  Enc Vitals Group     BP 03/14/18 1118 (!) 131/54     Pulse Rate 03/14/18 1118 93     Resp 03/14/18 1118 18     Temp 03/14/18 1118 (!) 97.4 F (36.3 C)     Temp Source 03/14/18 1118 Oral     SpO2 03/14/18 1118 100 %     Weight 03/14/18 1116 90 lb (40.8 kg)     Height 03/14/18 1116 5\' 2"  (1.575 m)     Head Circumference --      Peak Flow --      Pain Score 03/14/18 1116 8     Pain Loc --      Pain Edu? --      Excl. in GC? --     Constitutional: Alert and oriented. Well appearing and in no acute distress. Eyes: Conjunctivae are normal.  Pupils are equal slightly irregular but reactive.. EOMI. fundi are  somewhat difficult to see but look normal. Head: Atraumatic. Nose: No congestion/rhinnorhea. Mouth/Throat: Mucous membranes are moist.  Oropharynx non-erythematous. Neck: No stridor  cardiovascular: Normal rate, regular rhythm. Grossly normal heart sounds.  Good peripheral circulation. Respiratory: Normal respiratory effort.  No retractions. Lungs CTAB. Gastrointestinal: Soft and nontender. No distention. No abdominal bruits. No CVA tenderness Musculoskeletal: No lower extremity tenderness nor edema. Neurologic:  Normal speech and language. No gross focal neurologic deficits are appreciated.  Cranial nerves II through XII appear to be intact.  Finger-to-nose is normal motor strength is 5/5 throughout patient does not complain of any numbness.  Patient does not seem  to understand the thrust testing I cannot get her to do this as accurately as I would like.  She does have some vertigo when I lie her back and turn her head to one side or the other.  Appears to have nystagmus but again is very difficult to tell what she will not keep her eyes open. Skin:  Skin is warm, dry and intact. No rash noted. Psychiatric: Mood and affect are normal. Speech and behavior are normal.  ____________________________________________   LABS (all labs ordered are listed, but only abnormal results are displayed)  Labs Reviewed  BASIC METABOLIC PANEL - Abnormal; Notable for the following components:      Result Value   Glucose, Bld 105 (*)    Calcium 8.8 (*)    GFR calc non Af Amer 50 (*)    GFR calc Af Amer 57 (*)    All other components within normal limits  CBC - Abnormal; Notable for the following components:   Hemoglobin 11.1 (*)    HCT 34.9 (*)    MCHC 31.7 (*)    RDW 16.4 (*)    All other components within normal limits  TROPONIN I  TROPONIN I  URINALYSIS, COMPLETE (UACMP) WITH MICROSCOPIC   ____________________________________________  EKG  EKG read and interpreted by me shows sinus tachycardia rate of 106 she is having ventricular bigeminy do not see any acute ST-T segment changes however ____________________________________________  RADIOLOGY  ED MD interpretation:    Official radiology report(s): Dg Chest 2 View  Result Date: 03/14/2018 CLINICAL DATA:  Chest pain no short of breath EXAM: CHEST - 2 VIEW COMPARISON:  138 FINDINGS: Normal mediastinum and cardiac silhouette. No effusion, infiltrate or pneumothorax. Severe degenerate changes shoulders. Atherosclerotic calcification of the aorta. Compression fracture lower thoracic spine. IMPRESSION: No acute cardiopulmonary process. Electronically Signed   By: Genevive Bi M.D.   On: 03/14/2018 12:06   Mr Brain Wo Contrast  Result Date: 03/14/2018 CLINICAL DATA:  84 year old female with new onset  vertigo. Chest pain starting this morning. EXAM: MRI HEAD WITHOUT CONTRAST TECHNIQUE: Multiplanar, multiecho pulse sequences of the brain and surrounding structures were obtained without intravenous contrast. COMPARISON:  Head CT and cervical spine without contrast 06/13/2016. FINDINGS: Brain: No restricted diffusion to suggest acute infarction. No midline shift, mass effect, evidence of mass lesion, ventriculomegaly, extra-axial collection or acute intracranial hemorrhage. Cervicomedullary junction and pituitary are within normal limits. Small chronic right cerebellar infarcts redemonstrated. Otherwise largely normal for age gray and white matter signal in the brain. There is mildly asymmetric frontal and parietal lobe white matter T2 and FLAIR hyperintensity. No cerebral cortical encephalomalacia identified. No definite chronic cerebral blood products (susceptibility weighted imaging degraded by motion. Vascular: Major intracranial vascular flow voids are preserved. The left vertebral artery appears dominant. Skull and upper cervical spine: Stable visible cervical spine. Normal bone marrow  signal. Sinuses/Orbits: Postoperative changes to the right globe. Otherwise normal orbits soft tissues. Mild paranasal sinus mucosal thickening isolated to the right maxillary sinus. Other: Trace bilateral mastoid fluid, stable since 2018. There is a small retention cyst of the nasopharynx which also appears stable (series 10, image 4). Scalp and face soft tissues appear negative. IMPRESSION: 1.  No acute intracranial abnormality. 2. Mild for age chronic small vessel disease appears stable since 2018, including involvement in the right cerebellum. Electronically Signed   By: Odessa Fleming M.D.   On: 03/14/2018 14:43    ____________________________________________   PROCEDURES  Procedure(s) performed:   Procedures  Critical Care performed:   ____________________________________________   INITIAL IMPRESSION / ASSESSMENT  AND PLAN / ED COURSE  Patient has no further vertigo or chest pain.  Troponins are negative.  MRI is negative.  Patient looks well.  There was an episode where it looked like her blood pressure was low and her heart rate was low but she was not on the monitor she was just on the pulse ox and the reading was not that clear.  Patient's blood pressure came up immediately when she woke up.  She is awake alert and feels well.  We will let her go home have her follow-up with cardiology.  There is no further episodes of low heart rate.  And again I am not sure about the pulse ox reading.  She will return if she is worse.     Clinical Course as of Mar 15 1619  Fri Mar 14, 2018  1539 Glucose(!): 105 [PM]    Clinical Course User Index [PM] Arnaldo Natal, MD     ____________________________________________   FINAL CLINICAL IMPRESSION(S) / ED DIAGNOSES  Final diagnoses:  Chest pain, unspecified type  Vertigo     ED Discharge Orders    None       Note:  This document was prepared using Dragon voice recognition software and may include unintentional dictation errors.    Arnaldo Natal, MD 03/14/18 1620

## 2018-03-14 NOTE — Discharge Instructions (Addendum)
These take the Antivert 1 pill twice a day.  Be careful can make you sleepy.  Try not to fall.  The Antivert should help with the spinning sensation.  Please return here if you get further chest pain.  Otherwise call Dr. Glennis Brink office Monday morning.  They should be to get you in the office to check you over again Monday or Tuesday.  Continue your aspirin.

## 2019-07-13 DEATH — deceased
